# Patient Record
Sex: Female | Born: 1985 | Race: White | Hispanic: No | Marital: Single | Smoking: Never smoker
Health system: Southern US, Community
[De-identification: ages and names within clinical notes are randomized; demographics above are authoritative.]

## PROBLEM LIST (undated history)

## (undated) DIAGNOSIS — Z8489 Family history of other specified conditions: Secondary | ICD-10-CM

## (undated) DIAGNOSIS — K219 Gastro-esophageal reflux disease without esophagitis: Secondary | ICD-10-CM

## (undated) DIAGNOSIS — F419 Anxiety disorder, unspecified: Secondary | ICD-10-CM

## (undated) DIAGNOSIS — F329 Major depressive disorder, single episode, unspecified: Secondary | ICD-10-CM

## (undated) DIAGNOSIS — J45909 Unspecified asthma, uncomplicated: Secondary | ICD-10-CM

## (undated) DIAGNOSIS — N6310 Unspecified lump in the right breast, unspecified quadrant: Secondary | ICD-10-CM

## (undated) DIAGNOSIS — F32A Depression, unspecified: Secondary | ICD-10-CM

## (undated) HISTORY — DX: Depression, unspecified: F32.A

## (undated) HISTORY — DX: Major depressive disorder, single episode, unspecified: F32.9

## (undated) HISTORY — DX: Unspecified asthma, uncomplicated: J45.909

## (undated) HISTORY — DX: Anxiety disorder, unspecified: F41.9

## (undated) HISTORY — PX: WISDOM TOOTH EXTRACTION: SHX21

---
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
## 2006-03-14 ENCOUNTER — Encounter: Admission: RE | Admit: 2006-03-14 | Discharge: 2006-03-14 | Payer: Self-pay | Admitting: Pediatrics

## 2007-04-20 ENCOUNTER — Emergency Department (HOSPITAL_COMMUNITY): Admission: EM | Admit: 2007-04-20 | Discharge: 2007-04-20 | Payer: Self-pay | Admitting: Emergency Medicine

## 2007-04-29 ENCOUNTER — Encounter: Admission: RE | Admit: 2007-04-29 | Discharge: 2007-04-29 | Payer: Self-pay | Admitting: Family Medicine

## 2007-05-26 ENCOUNTER — Encounter: Admission: RE | Admit: 2007-05-26 | Discharge: 2007-05-26 | Payer: Self-pay | Admitting: Obstetrics and Gynecology

## 2009-09-07 ENCOUNTER — Encounter: Admission: RE | Admit: 2009-09-07 | Discharge: 2009-09-07 | Payer: Self-pay | Admitting: Obstetrics and Gynecology

## 2011-01-05 ENCOUNTER — Emergency Department (HOSPITAL_COMMUNITY)
Admission: EM | Admit: 2011-01-05 | Discharge: 2011-01-06 | Disposition: A | Discharge status: Home / Self Care | Payer: Medicaid Other | Attending: Emergency Medicine | Admitting: Emergency Medicine

## 2011-01-05 ENCOUNTER — Emergency Department (HOSPITAL_COMMUNITY): Payer: Medicaid Other

## 2011-01-05 DIAGNOSIS — Y92009 Unspecified place in unspecified non-institutional (private) residence as the place of occurrence of the external cause: Secondary | ICD-10-CM | POA: Insufficient documentation

## 2011-01-05 DIAGNOSIS — S5000XA Contusion of unspecified elbow, initial encounter: Secondary | ICD-10-CM | POA: Insufficient documentation

## 2011-01-05 DIAGNOSIS — W010XXA Fall on same level from slipping, tripping and stumbling without subsequent striking against object, initial encounter: Secondary | ICD-10-CM | POA: Insufficient documentation

## 2011-08-21 LAB — URINALYSIS, ROUTINE W REFLEX MICROSCOPIC
Bilirubin Urine: NEGATIVE
Glucose, UA: NEGATIVE
Ketones, ur: NEGATIVE
Leukocytes, UA: NEGATIVE
Nitrite: NEGATIVE
Protein, ur: NEGATIVE
Specific Gravity, Urine: 1.025
Urobilinogen, UA: 0.2
pH: 6

## 2011-08-21 LAB — URINE MICROSCOPIC-ADD ON

## 2011-08-21 LAB — PREGNANCY, URINE: Preg Test, Ur: NEGATIVE

## 2012-06-28 ENCOUNTER — Encounter (HOSPITAL_COMMUNITY): Payer: Self-pay | Admitting: *Deleted

## 2012-06-28 ENCOUNTER — Emergency Department (HOSPITAL_COMMUNITY): Payer: Medicaid Other

## 2012-06-28 ENCOUNTER — Emergency Department (HOSPITAL_COMMUNITY)
Admission: EM | Admit: 2012-06-28 | Discharge: 2012-06-28 | Disposition: A | Discharge status: Home / Self Care | Payer: Medicaid Other | Attending: Emergency Medicine | Admitting: Emergency Medicine

## 2012-06-28 DIAGNOSIS — S92309A Fracture of unspecified metatarsal bone(s), unspecified foot, initial encounter for closed fracture: Secondary | ICD-10-CM | POA: Insufficient documentation

## 2012-06-28 DIAGNOSIS — S92301A Fracture of unspecified metatarsal bone(s), right foot, initial encounter for closed fracture: Secondary | ICD-10-CM

## 2012-06-28 DIAGNOSIS — W108XXA Fall (on) (from) other stairs and steps, initial encounter: Secondary | ICD-10-CM | POA: Insufficient documentation

## 2012-06-28 DIAGNOSIS — Y92009 Unspecified place in unspecified non-institutional (private) residence as the place of occurrence of the external cause: Secondary | ICD-10-CM | POA: Insufficient documentation

## 2012-06-28 MED ORDER — IBUPROFEN 800 MG PO TABS
800.0000 mg | ORAL_TABLET | Freq: Once | ORAL | Status: AC
  Administered 2012-06-28: 800 mg via ORAL
  Filled 2012-06-28: qty 1

## 2012-06-28 MED ORDER — MELOXICAM 7.5 MG PO TABS: 7.5000 mg | ORAL_TABLET | Freq: Every day | ORAL | Status: AC

## 2012-06-28 MED ORDER — HYDROCODONE-ACETAMINOPHEN 5-325 MG PO TABS
2.0000 | ORAL_TABLET | Freq: Once | ORAL | Status: AC
  Administered 2012-06-28: 2 via ORAL
  Filled 2012-06-28: qty 2

## 2012-06-28 MED ORDER — HYDROCODONE-ACETAMINOPHEN 7.5-325 MG PO TABS: 1.0000 | ORAL_TABLET | ORAL | Status: DC | PRN

## 2012-06-28 MED ORDER — ONDANSETRON HCL 4 MG PO TABS
4.0000 mg | ORAL_TABLET | Freq: Once | ORAL | Status: AC
  Administered 2012-06-28: 4 mg via ORAL
  Filled 2012-06-28: qty 1

## 2012-06-28 NOTE — ED Provider Notes (Signed)
Medical screening examination/treatment/procedure(s) were performed by non-physician practitioner and as supervising physician I was immediately available for consultation/collaboration.   Shelda Jakes, MD 06/28/12 [phone]

## 2012-06-28 NOTE — ED Notes (Signed)
Pt presents with rt foot pain, twisted foot on steps.

## 2012-06-28 NOTE — ED Provider Notes (Signed)
History     CSN: [id number]  Arrival date & time 06/28/12  1641   First MD Initiated Contact with Patient 06/28/12 1701      Chief Complaint  Patient presents with  . Foot Injury    (Consider location/radiation/quality/duration/timing/severity/associated sxs/prior treatment) Patient is a 26 y.o. female presenting with foot injury. The history is provided by the patient.  Foot Injury  The incident occurred 1 to 2 hours ago. The incident occurred at home. The injury mechanism was a fall (fell down steps at home.). The pain is present in the left ankle. The quality of the pain is described as throbbing. The pain is moderate. The pain has been constant since onset. Associated symptoms include inability to bear weight. Pertinent negatives include no loss of sensation. She reports no foreign bodies present. The symptoms are aggravated by bearing weight and palpation. She has tried nothing for the symptoms.    History reviewed. No pertinent past medical history.  History reviewed. No pertinent past surgical history.  No family history on file.  History  Substance Use Topics  . Smoking status: Never Smoker   . Smokeless tobacco: Not on file  . Alcohol Use: Yes     occassional    OB History    Grav Para Term Preterm Abortions TAB SAB Ect Mult Living                  Review of Systems  Constitutional: Negative for activity change.       All ROS Neg except as noted in HPI  HENT: Negative for nosebleeds and neck pain.   Eyes: Negative for photophobia and discharge.  Respiratory: Negative for cough, shortness of breath and wheezing.   Cardiovascular: Negative for chest pain and palpitations.  Gastrointestinal: Negative for abdominal pain and blood in stool.  Genitourinary: Negative for dysuria, frequency and hematuria.  Musculoskeletal: Negative for back pain and arthralgias.  Skin: Negative.   Neurological: Negative for dizziness, seizures and speech difficulty.    Psychiatric/Behavioral: Negative for hallucinations and confusion.    Allergies  Penicillins  Home Medications  No current outpatient prescriptions on file.  BP 124/76  Pulse 92  Temp 98.7 F (37.1 C) (Oral)  Resp 18  Ht 5\' 7"  (1.702 m)  Wt 170 lb (77.111 kg)  BMI 26.63 kg/m2  SpO2 98%  LMP 05/28/2012  Physical Exam  Nursing note and vitals reviewed. Constitutional: She is oriented to person, place, and time. She appears well-developed and well-nourished.  Non-toxic appearance.  HENT:  Head: Normocephalic.  Right Ear: Tympanic membrane and external ear normal.  Left Ear: Tympanic membrane and external ear normal.  Eyes: EOM and lids are normal. Pupils are equal, round, and reactive to light.  Neck: Normal range of motion. Neck supple. Carotid bruit is not present.  Cardiovascular: Normal rate, regular rhythm, normal heart sounds, intact distal pulses and normal pulses.   Pulmonary/Chest: Breath sounds normal. No respiratory distress.  Abdominal: Soft. Bowel sounds are normal. There is no tenderness. There is no guarding.  Musculoskeletal: Normal range of motion.       Pain and swelling of the lateral malleolus. Good cap refill and sensory of the toes.  Achilles intact. DP pulse 2+  Lymphadenopathy:       Head (right side): No submandibular adenopathy present.       Head (left side): No submandibular adenopathy present.    She has no cervical adenopathy.  Neurological: She is alert and oriented to person, place,  and time. She has normal strength. No cranial nerve deficit or sensory deficit.  Skin: Skin is warm and dry.  Psychiatric: She has a normal mood and affect. Her speech is normal.    ED Course  Procedures (including critical care time)  Labs Reviewed - No data to display No results found.   No diagnosis found.    MDM  I have reviewed nursing notes, vital signs, and all appropriate lab and imaging results for this patient. Patient sustained an injury to  the right foot earlier today. She has increasing pain and swelling of the right foot. X-ray of the right foot and ankle reveal a nondisplaced fracture at the base of the fifth metatarsal. The patient has been fitted with a posterior splint and crutches. Prescription for Mobic 7.5 mg 2 times daily, and Norco 7.5 mg every 4 hours as been given to the patient. Patient has been given orthopedic referral.       Kathie Dike, PA 06/28/12 1821

## 2012-06-28 NOTE — ED Notes (Signed)
Pt states twisted rt foot on stairs. Pt has small swelling noted on  Foot. Denies other injuries at this time.

## 2012-06-30 ENCOUNTER — Encounter: Payer: Self-pay | Admitting: Orthopedic Surgery

## 2012-06-30 ENCOUNTER — Ambulatory Visit (INDEPENDENT_AMBULATORY_CARE_PROVIDER_SITE_OTHER): Payer: Medicaid Other | Admitting: Orthopedic Surgery

## 2012-06-30 VITALS — BP 100/70 | Ht 67.0 in | Wt 170.0 lb

## 2012-06-30 DIAGNOSIS — S92309A Fracture of unspecified metatarsal bone(s), unspecified foot, initial encounter for closed fracture: Secondary | ICD-10-CM

## 2012-06-30 MED ORDER — HYDROCODONE-ACETAMINOPHEN 7.5-325 MG PO TABS: 1.0000 | ORAL_TABLET | ORAL | Status: AC | PRN

## 2012-06-30 NOTE — Progress Notes (Signed)
  Subjective:    Patient ID: Jade Kelly, female    DOB: 05-21-1986, 26 y.o.   MRN: [id number] Chief Complaint  Patient presents with  . Foot Injury    right foot fracture, DOI 06/28/12    Foot Injury  The incident occurred 3 to 5 days ago. The injury mechanism was a fall. The pain is present in the right foot. The pain is moderate. The pain has been constant since onset. Pertinent negatives include no inability to bear weight, loss of motion, loss of sensation, muscle weakness, numbness or tingling. Associated symptoms comments: Swelling.      Review of Systems  Neurological: Negative for tingling and numbness.   as recorded see scanned documents     Objective:   Physical Exam  Constitutional: She is oriented to person, place, and time. She appears well-developed and well-nourished. No distress.  HENT:  Head: Normocephalic.  Eyes: Pupils are equal, round, and reactive to light.  Neck: Normal range of motion.  Cardiovascular: Normal rate and intact distal pulses.   Pulmonary/Chest: Effort normal.  Musculoskeletal: Normal range of motion.       Upper extremities Muscle tone normal, all joints are reduced without subluxation. No joint contractures. No swelling.  Right lower extremity Swollen right foot Tenderness over the proximal fifth metatarsal. No crepitance appear Alignment is normal Ankle range of motion normal Muscle tone normal no atrophy Ankle joint stable  Neurological: She is alert and oriented to person, place, and time. She has normal reflexes.  Skin: She is not diaphoretic.  Psychiatric: She has a normal mood and affect. Her behavior is normal. Judgment and thought content normal.     Imaging x-rays from the hospital show a transverse fracture at the proximal portion of the fifth metatarsal which is nondisplaced and proximal to the area of the true Jones fracture     Assessment & Plan:  Proximal fifth metatarsal fracture nondisplaced  Recommend hard  soled shoe weightbearing as tolerated x-ray in 6 weeks

## 2012-06-30 NOTE — Patient Instructions (Addendum)
Walk in shoe x 6 weeks

## 2012-08-11 ENCOUNTER — Ambulatory Visit (INDEPENDENT_AMBULATORY_CARE_PROVIDER_SITE_OTHER): Payer: Medicaid Other

## 2012-08-11 ENCOUNTER — Encounter: Payer: Self-pay | Admitting: Orthopedic Surgery

## 2012-08-11 ENCOUNTER — Ambulatory Visit: Payer: Medicaid Other | Admitting: Orthopedic Surgery

## 2012-08-11 ENCOUNTER — Ambulatory Visit (INDEPENDENT_AMBULATORY_CARE_PROVIDER_SITE_OTHER): Payer: Medicaid Other | Admitting: Orthopedic Surgery

## 2012-08-11 VITALS — Ht 67.0 in | Wt 170.0 lb

## 2012-08-11 DIAGNOSIS — S92901A Unspecified fracture of right foot, initial encounter for closed fracture: Secondary | ICD-10-CM

## 2012-08-11 DIAGNOSIS — S92909A Unspecified fracture of unspecified foot, initial encounter for closed fracture: Secondary | ICD-10-CM

## 2012-08-11 NOTE — Progress Notes (Signed)
Patient ID: Jade Kelly, female   DOB: [birth year], 25 y.o.   MRN: [id number]  Chief Complaint  Patient presents with  . Follow-up    6 week recheck on right foot fracture. Xrays today.    And the x-ray shows: Complete resolution of a fracture at the 5th metatarsal on the proximal leg.  Clinical exam is normal.  Return to normal activities

## 2012-08-11 NOTE — Patient Instructions (Addendum)
activities as tolerated 

## 2013-09-29 ENCOUNTER — Other Ambulatory Visit: Payer: Self-pay | Admitting: Family Medicine

## 2013-09-29 DIAGNOSIS — N6001 Solitary cyst of right breast: Secondary | ICD-10-CM

## 2013-10-05 ENCOUNTER — Other Ambulatory Visit: Payer: Self-pay

## 2013-10-05 DIAGNOSIS — Z1231 Encounter for screening mammogram for malignant neoplasm of breast: Secondary | ICD-10-CM

## 2013-10-06 ENCOUNTER — Ambulatory Visit
Admission: RE | Admit: 2013-10-06 | Discharge: 2013-10-06 | Disposition: A | Discharge status: Home / Self Care | Payer: Medicaid Other | Source: Ambulatory Visit | Attending: Family Medicine | Admitting: Family Medicine

## 2013-10-06 DIAGNOSIS — N6001 Solitary cyst of right breast: Secondary | ICD-10-CM

## 2013-10-08 ENCOUNTER — Ambulatory Visit
Admission: RE | Admit: 2013-10-08 | Discharge: 2013-10-08 | Disposition: A | Discharge status: Home / Self Care | Payer: Medicaid Other | Source: Ambulatory Visit | Attending: Family Medicine | Admitting: Family Medicine

## 2013-10-08 DIAGNOSIS — N6001 Solitary cyst of right breast: Secondary | ICD-10-CM

## 2013-10-26 ENCOUNTER — Ambulatory Visit (INDEPENDENT_AMBULATORY_CARE_PROVIDER_SITE_OTHER): Payer: Medicaid Other | Admitting: Surgery

## 2013-11-04 DIAGNOSIS — N6310 Unspecified lump in the right breast, unspecified quadrant: Secondary | ICD-10-CM

## 2013-11-04 HISTORY — DX: Unspecified lump in the right breast, unspecified quadrant: N63.10

## 2013-11-08 ENCOUNTER — Encounter (INDEPENDENT_AMBULATORY_CARE_PROVIDER_SITE_OTHER): Payer: Self-pay

## 2013-11-08 ENCOUNTER — Encounter (INDEPENDENT_AMBULATORY_CARE_PROVIDER_SITE_OTHER): Payer: Self-pay | Admitting: Surgery

## 2013-11-08 ENCOUNTER — Ambulatory Visit (INDEPENDENT_AMBULATORY_CARE_PROVIDER_SITE_OTHER): Payer: Medicaid Other | Admitting: Surgery

## 2013-11-08 VITALS — BP 118/80 | HR 84 | Temp 98.9°F | Resp 15 | Ht 67.0 in | Wt 190.0 lb

## 2013-11-08 DIAGNOSIS — N6310 Unspecified lump in the right breast, unspecified quadrant: Secondary | ICD-10-CM

## 2013-11-08 DIAGNOSIS — N63 Unspecified lump in unspecified breast: Secondary | ICD-10-CM

## 2013-11-08 NOTE — Patient Instructions (Signed)
Lumpectomy A lumpectomy is a form of "breast conserving" or "breast preservation" surgery. It may also be referred to as a partial mastectomy. During a lumpectomy, the portion of the breast that contains the cancerous tumor or breast mass (the lump) is removed. Some normal tissue around the lump may also be removed to make sure all the tumor has been removed. This surgery should take 40 minutes or less. LET YOUR HEALTH CARE PROVIDER KNOW ABOUT:  Any allergies you have.  All medicines you are taking, including vitamins, herbs, eye drops, creams, and over-the-counter medicines.  Previous problems you or members of your family have had with the use of anesthetics.  Any blood disorders you have.  Previous surgeries you have had.  Medical conditions you have. RISKS AND COMPLICATIONS Generally, this is a safe procedure. However, as with any procedure, complications can occur. Possible complications include:  Bleeding.  Infection.  Pain.  Temporary swelling.  Change in the shape of the breast, particularly if a large portion is removed. BEFORE THE PROCEDURE  Ask your health care provider about changing or stopping your regular medicines.  Do not eat or drink anything for 7 8 hours before the surgery or as directed by your health care provider. Ask your health care provider if you can take a sip of water with any approved medicines.  On the day of surgery, your healthcare provider will use a mammogram or ultrasound to locate and mark the tumor in your breast. These markings on your breast will show where the cut (incision) will be made. PROCEDURE   An IV tube will be put into one of your veins.  You may be given medicine to help you relax before the surgery (sedative). You will be given one of the following:  A medicine that numbs the area (local anesthesia).  A medicine that makes you go to sleep (general anesthesia).  Your health care provider will use a kind of electric scalpel  that uses heat to minimize bleeding (electrocautery knife).  A curved incision (like a smile or frown) that follows the natural curve of your breast is made, to allow for minimal scarring and better healing.  The tumor will be removed with some of the surrounding tissue. This will be sent to the lab for analysis. Your health care provider may also remove your lymph nodes at this time if needed.  Sometimes, but not always, a rubber tube called a drain will be surgically inserted into your breast area or armpit to collect excess fluid that may accumulate in the space where the tumor was. This drain is connected to a plastic bulb on the outside of your body. This drain creates suction to help remove the fluid.  The incisions will be closed with stitches (sutures).  A bandage may be placed over the incisions. AFTER THE PROCEDURE  You will be taken to the recovery area.  You will be given medicine for pain.  A small rubber drain may be placed in the breast for 2 3 days to prevent a collection of blood (hematoma) from developing in the breast. You will be given instructions on caring for the drain before you go home.  A pressure bandage (dressing) will be applied for 1 2 days to prevent bleeding. Ask your health care provider how to care for your bandage at home. Document Released: 12/02/2006 Document Revised: 06/23/2013 Document Reviewed: 03/26/2013 [employer] Patient Information 2014 [employer].  

## 2013-11-08 NOTE — Progress Notes (Signed)
Patient ID: Jade Kelly, female   DOB: 08/09/1986, 28 y.o.   MRN: [id number]  Chief Complaint  Patient presents with  . New Evaluation    eval rt br cyst?    HPI Jade Kelly is a 28 y.o. female.  Patient sent at request of Dr. Eagle for right breast mass. Patient has noted a tender area in her right upper-outer quadrant breast for at least 2 months. It is very tender and limits her activities. She was evaluated with ultrasound which showed a cluster of cysts in this area and aspiration was attempted but was unsuccessful. The area persists and the patient wishes to have it removed. She has no other areas of pain in either breast except this. There is no nipple discharge. No redness of breast. HPI  Past Medical History  Diagnosis Date  . Depression   . Asthma   . Anxiety     Past Surgical History  Procedure Laterality Date  . Wisdom tooth extraction      Family History  Problem Relation Age of Onset  . Diabetes      Social History History  Substance Use Topics  . Smoking status: Never Smoker   . Smokeless tobacco: Never Used  . Alcohol Use: Yes     Comment: occassional    Allergies  Allergen Reactions  . Penicillins Rash    Current Outpatient Prescriptions  Medication Sig Dispense Refill  . albuterol (PROAIR HFA) 108 (90 BASE) MCG/ACT inhaler       . BuPROPion HCl (WELLBUTRIN PO) Take by mouth.      . FLUoxetine HCl (PROZAC PO) Take by mouth.      . Norgestim-Eth Estrad Triphasic (ORTHO TRI-CYCLEN LO PO) Take by mouth.       No current facility-administered medications for this visit.    Review of Systems Review of Systems  Constitutional: Negative for fever, chills and unexpected weight change.  HENT: Negative for congestion, hearing loss, sore throat, trouble swallowing and voice change.   Eyes: Negative for visual disturbance.  Respiratory: Negative for cough and wheezing.   Cardiovascular: Negative for chest pain, palpitations and leg swelling.    Gastrointestinal: Negative for nausea, vomiting, abdominal pain, diarrhea, constipation, blood in stool, abdominal distention and anal bleeding.  Genitourinary: Negative for hematuria, vaginal bleeding and difficulty urinating.  Musculoskeletal: Negative for arthralgias.  Skin: Negative for rash and wound.  Neurological: Negative for seizures, syncope and headaches.  Hematological: Negative for adenopathy. Does not bruise/bleed easily.  Psychiatric/Behavioral: Negative for confusion.    Blood pressure 118/80, pulse 84, temperature 98.9 F (37.2 C), temperature source Temporal, resp. rate 15, height 5' 7" (1.702 m), weight 190 lb (86.183 kg).  Physical Exam Physical Exam  Constitutional: She is oriented to person, place, and time. She appears well-developed and well-nourished.  HENT:  Head: Normocephalic and atraumatic.  Eyes: Pupils are equal, round, and reactive to light. No scleral icterus.  Neck: Normal range of motion. Neck supple.  Cardiovascular: Normal rate and regular rhythm.   Pulmonary/Chest: Effort normal and breath sounds normal.    Musculoskeletal: Normal range of motion.  Lymphadenopathy:    She has no cervical adenopathy.    She has no axillary adenopathy.  Neurological: She is alert and oriented to person, place, and time.  Skin: Skin is warm and dry.  Psychiatric: She has a normal mood and affect. Her behavior is normal. Judgment and thought content normal.    Data Reviewed CLINICAL DATA: Patient has been experiencing   pain in the right  upper outer quadrant.  EXAM:  ULTRASOUND OF THE RIGHT BREAST  COMPARISON: 05/26/2007, 09/07/2009  FINDINGS:  On physical exam,no mass is palpated in the right upper outer  quadrant. The patient points to the area of concern as being at 10  o'clock 5 cm from the right nipple.  Ultrasound is performed, showing clustered cysts in this location  with the largest cyst measuring 8 mm. There is no suspicious solid  mass,  distortion, or shadowing to suggest malignancy.  IMPRESSION:  Clustered cysts in the right upper outer quadrant with no  sonographic evidence of malignancy.  RECOMMENDATION:  Yearly screening mammography should begin at age 79 unless  clinically indicated earlier.  I have discussed the findings and recommendations with the patient.  Results were also provided in writing at the conclusion of the  visit. If applicable, a reminder letter will be sent to the patient  regarding the next appointment.  BI-RADS CATEGORY 2: Benign Finding(s)  Electronically Signed  By: Ulyess Blossom M.D.  On: 10/06/2013 11:44   Assessment    Painful right breast mass/cyst    Plan    The patient would like to have the area removed due to discomfort. Explained this may not help her breast pain. She still would like to proceed. Recommend right breast excisional biopsy.The procedure has been discussed with the patient.  Alternative therapies have been discussed with the patient.  Operative risks include bleeding,  Infection,  Organ injury,  Nerve injury,  Blood vessel injury,  DVT,  Pulmonary embolism,  Death,  And possible reoperation.  Medical management risks include worsening of present situation.  The success of the procedure is 50 -90 % at treating patients symptoms.  The patient understands and agrees to proceed.       Chelle Cayton A. 11/08/2013, 2:41 PM

## 2013-11-25 ENCOUNTER — Encounter (HOSPITAL_BASED_OUTPATIENT_CLINIC_OR_DEPARTMENT_OTHER): Payer: Self-pay | Admitting: *Deleted

## 2013-12-02 ENCOUNTER — Encounter (HOSPITAL_BASED_OUTPATIENT_CLINIC_OR_DEPARTMENT_OTHER): Payer: Self-pay | Admitting: Anesthesiology

## 2013-12-02 ENCOUNTER — Ambulatory Visit (HOSPITAL_BASED_OUTPATIENT_CLINIC_OR_DEPARTMENT_OTHER)
Admission: RE | Admit: 2013-12-02 | Discharge: 2013-12-02 | Disposition: A | Discharge status: Home / Self Care | Payer: Medicaid Other | Source: Ambulatory Visit | Attending: Surgery | Admitting: Surgery

## 2013-12-02 ENCOUNTER — Ambulatory Visit (HOSPITAL_BASED_OUTPATIENT_CLINIC_OR_DEPARTMENT_OTHER): Payer: Medicaid Other | Admitting: Anesthesiology

## 2013-12-02 ENCOUNTER — Encounter (HOSPITAL_BASED_OUTPATIENT_CLINIC_OR_DEPARTMENT_OTHER): Payer: Medicaid Other | Admitting: Anesthesiology

## 2013-12-02 ENCOUNTER — Encounter (HOSPITAL_BASED_OUTPATIENT_CLINIC_OR_DEPARTMENT_OTHER)
Admission: RE | Disposition: A | Discharge status: Home / Self Care | Payer: Self-pay | Source: Ambulatory Visit | Attending: Surgery

## 2013-12-02 DIAGNOSIS — N6310 Unspecified lump in the right breast, unspecified quadrant: Secondary | ICD-10-CM

## 2013-12-02 DIAGNOSIS — D249 Benign neoplasm of unspecified breast: Secondary | ICD-10-CM | POA: Insufficient documentation

## 2013-12-02 DIAGNOSIS — F411 Generalized anxiety disorder: Secondary | ICD-10-CM | POA: Insufficient documentation

## 2013-12-02 DIAGNOSIS — N63 Unspecified lump in unspecified breast: Secondary | ICD-10-CM | POA: Insufficient documentation

## 2013-12-02 DIAGNOSIS — F3289 Other specified depressive episodes: Secondary | ICD-10-CM | POA: Insufficient documentation

## 2013-12-02 DIAGNOSIS — N6019 Diffuse cystic mastopathy of unspecified breast: Secondary | ICD-10-CM

## 2013-12-02 DIAGNOSIS — F329 Major depressive disorder, single episode, unspecified: Secondary | ICD-10-CM | POA: Insufficient documentation

## 2013-12-02 DIAGNOSIS — J45909 Unspecified asthma, uncomplicated: Secondary | ICD-10-CM | POA: Insufficient documentation

## 2013-12-02 HISTORY — DX: Unspecified lump in the right breast, unspecified quadrant: N63.10

## 2013-12-02 HISTORY — DX: Family history of other specified conditions: Z84.89

## 2013-12-02 HISTORY — PX: EXCISION OF BREAST BIOPSY: SHX5822

## 2013-12-02 LAB — POCT HEMOGLOBIN-HEMACUE: Hemoglobin: 13.6 g/dL (ref 12.0–15.0)

## 2013-12-02 SURGERY — EXCISION OF BREAST BIOPSY
Anesthesia: General | Site: Breast | Laterality: Right

## 2013-12-02 MED ORDER — EPHEDRINE SULFATE 50 MG/ML IJ SOLN
INTRAMUSCULAR | Status: DC | PRN
  Administered 2013-12-02: 5 mg via INTRAVENOUS
  Administered 2013-12-02 (×2): 10 mg via INTRAVENOUS

## 2013-12-02 MED ORDER — LACTATED RINGERS IV SOLN
INTRAVENOUS | Status: DC
  Administered 2013-12-02 (×2): via INTRAVENOUS

## 2013-12-02 MED ORDER — PROPOFOL 10 MG/ML IV BOLUS
INTRAVENOUS | Status: DC | PRN
  Administered 2013-12-02: 200 mg via INTRAVENOUS

## 2013-12-02 MED ORDER — MIDAZOLAM HCL 2 MG/2ML IJ SOLN: 1.0000 mg | INTRAMUSCULAR | Status: DC | PRN

## 2013-12-02 MED ORDER — BUPIVACAINE-EPINEPHRINE PF 0.25-1:200000 % IJ SOLN
INTRAMUSCULAR | Status: AC
  Filled 2013-12-02: qty 30

## 2013-12-02 MED ORDER — FENTANYL CITRATE 0.05 MG/ML IJ SOLN
INTRAMUSCULAR | Status: AC
  Filled 2013-12-02: qty 6

## 2013-12-02 MED ORDER — CLINDAMYCIN PHOSPHATE 300 MG/50ML IV SOLN
INTRAVENOUS | Status: AC
  Filled 2013-12-02: qty 50

## 2013-12-02 MED ORDER — FENTANYL CITRATE 0.05 MG/ML IJ SOLN: 50.0000 ug | INTRAMUSCULAR | Status: DC | PRN

## 2013-12-02 MED ORDER — LIDOCAINE HCL (CARDIAC) 20 MG/ML IV SOLN
INTRAVENOUS | Status: DC | PRN
  Administered 2013-12-02: 50 mg via INTRAVENOUS

## 2013-12-02 MED ORDER — MIDAZOLAM HCL 2 MG/2ML IJ SOLN
INTRAMUSCULAR | Status: AC
  Filled 2013-12-02: qty 2

## 2013-12-02 MED ORDER — OXYCODONE HCL 5 MG/5ML PO SOLN: 5.0000 mg | Freq: Once | ORAL | Status: DC | PRN

## 2013-12-02 MED ORDER — FENTANYL CITRATE 0.05 MG/ML IJ SOLN
INTRAMUSCULAR | Status: DC | PRN
  Administered 2013-12-02: 100 ug via INTRAVENOUS

## 2013-12-02 MED ORDER — MIDAZOLAM HCL 5 MG/5ML IJ SOLN
INTRAMUSCULAR | Status: DC | PRN
  Administered 2013-12-02: 2 mg via INTRAVENOUS

## 2013-12-02 MED ORDER — BUPIVACAINE-EPINEPHRINE 0.25% -1:200000 IJ SOLN
INTRAMUSCULAR | Status: DC | PRN
  Administered 2013-12-02: 10 mL

## 2013-12-02 MED ORDER — MIDAZOLAM HCL 2 MG/ML PO SYRP: 12.0000 mg | ORAL_SOLUTION | Freq: Once | ORAL | Status: DC | PRN

## 2013-12-02 MED ORDER — ONDANSETRON HCL 4 MG/2ML IJ SOLN
INTRAMUSCULAR | Status: DC | PRN
  Administered 2013-12-02: 4 mg via INTRAVENOUS

## 2013-12-02 MED ORDER — HYDROMORPHONE HCL PF 1 MG/ML IJ SOLN
0.2500 mg | INTRAMUSCULAR | Status: DC | PRN
  Administered 2013-12-02: 0.5 mg via INTRAVENOUS
  Filled 2013-12-02: qty 1

## 2013-12-02 MED ORDER — OXYCODONE-ACETAMINOPHEN 5-325 MG PO TABS: 1.0000 | ORAL_TABLET | ORAL | Status: DC | PRN

## 2013-12-02 MED ORDER — 0.9 % SODIUM CHLORIDE (POUR BTL) OPTIME
TOPICAL | Status: DC | PRN
  Administered 2013-12-02: 200 mL

## 2013-12-02 MED ORDER — DEXAMETHASONE SODIUM PHOSPHATE 4 MG/ML IJ SOLN
INTRAMUSCULAR | Status: DC | PRN
  Administered 2013-12-02: 10 mg via INTRAVENOUS

## 2013-12-02 MED ORDER — OXYCODONE HCL 5 MG PO TABS: 5.0000 mg | ORAL_TABLET | Freq: Once | ORAL | Status: DC | PRN

## 2013-12-02 MED ORDER — ONDANSETRON HCL 4 MG/2ML IJ SOLN: 4.0000 mg | Freq: Once | INTRAMUSCULAR | Status: DC | PRN

## 2013-12-02 MED ORDER — CLINDAMYCIN PHOSPHATE 300 MG/50ML IV SOLN
300.0000 mg | Freq: Once | INTRAVENOUS | Status: AC
  Administered 2013-12-02: 300 mg via INTRAVENOUS

## 2013-12-02 SURGICAL SUPPLY — 50 items
BINDER BREAST LRG (GAUZE/BANDAGES/DRESSINGS) IMPLANT
BINDER BREAST MEDIUM (GAUZE/BANDAGES/DRESSINGS) IMPLANT
BINDER BREAST XLRG (GAUZE/BANDAGES/DRESSINGS) IMPLANT
BINDER BREAST XXLRG (GAUZE/BANDAGES/DRESSINGS) IMPLANT
BLADE SURG 15 STRL LF DISP TIS (BLADE) ×1 IMPLANT
BLADE SURG 15 STRL SS (BLADE) ×2
CANISTER SUCT 1200ML W/VALVE (MISCELLANEOUS) ×3 IMPLANT
CHLORAPREP W/TINT 26ML (MISCELLANEOUS) ×3 IMPLANT
CLIP TI WIDE RED SMALL 6 (CLIP) IMPLANT
COVER MAYO STAND STRL (DRAPES) ×3 IMPLANT
COVER TABLE BACK 60X90 (DRAPES) ×3 IMPLANT
DECANTER SPIKE VIAL GLASS SM (MISCELLANEOUS) ×3 IMPLANT
DERMABOND ADVANCED (GAUZE/BANDAGES/DRESSINGS) ×2
DERMABOND ADVANCED .7 DNX12 (GAUZE/BANDAGES/DRESSINGS) ×1 IMPLANT
DEVICE DUBIN W/COMP PLATE 8390 (MISCELLANEOUS) IMPLANT
DRAPE LAPAROSCOPIC ABDOMINAL (DRAPES) IMPLANT
DRAPE PED LAPAROTOMY (DRAPES) ×3 IMPLANT
DRAPE UTILITY XL STRL (DRAPES) ×3 IMPLANT
ELECT COATED BLADE 2.86 ST (ELECTRODE) ×3 IMPLANT
ELECT REM PT RETURN 9FT ADLT (ELECTROSURGICAL) ×3
ELECTRODE REM PT RTRN 9FT ADLT (ELECTROSURGICAL) ×1 IMPLANT
GLOVE BIOGEL M STRL SZ7.5 (GLOVE) ×3 IMPLANT
GLOVE BIOGEL PI IND STRL 7.5 (GLOVE) ×1 IMPLANT
GLOVE BIOGEL PI IND STRL 8 (GLOVE) ×2 IMPLANT
GLOVE BIOGEL PI INDICATOR 7.5 (GLOVE) ×2
GLOVE BIOGEL PI INDICATOR 8 (GLOVE) ×4
GLOVE ECLIPSE 7.0 STRL STRAW (GLOVE) ×3 IMPLANT
GLOVE ECLIPSE 8.0 STRL XLNG CF (GLOVE) ×3 IMPLANT
GLOVE EXAM NITRILE LRG STRL (GLOVE) ×3 IMPLANT
GOWN STRL REUS W/ TWL LRG LVL3 (GOWN DISPOSABLE) ×2 IMPLANT
GOWN STRL REUS W/TWL 2XL LVL3 (GOWN DISPOSABLE) ×3 IMPLANT
GOWN STRL REUS W/TWL LRG LVL3 (GOWN DISPOSABLE) ×4
KIT MARKER MARGIN INK (KITS) IMPLANT
NEEDLE HYPO 25X1 1.5 SAFETY (NEEDLE) ×3 IMPLANT
NS IRRIG 1000ML POUR BTL (IV SOLUTION) ×3 IMPLANT
PACK BASIN DAY SURGERY FS (CUSTOM PROCEDURE TRAY) ×3 IMPLANT
PENCIL BUTTON HOLSTER BLD 10FT (ELECTRODE) ×3 IMPLANT
SLEEVE SCD COMPRESS KNEE MED (MISCELLANEOUS) ×3 IMPLANT
SPONGE LAP 4X18 X RAY DECT (DISPOSABLE) ×3 IMPLANT
STAPLER VISISTAT 35W (STAPLE) IMPLANT
SUT MON AB 4-0 PC3 18 (SUTURE) ×3 IMPLANT
SUT SILK 2 0 SH (SUTURE) IMPLANT
SUT VIC AB 3-0 SH 27 (SUTURE) ×2
SUT VIC AB 3-0 SH 27X BRD (SUTURE) ×1 IMPLANT
SYR CONTROL 10ML LL (SYRINGE) ×3 IMPLANT
TOWEL OR 17X24 6PK STRL BLUE (TOWEL DISPOSABLE) ×6 IMPLANT
TOWEL OR NON WOVEN STRL DISP B (DISPOSABLE) ×3 IMPLANT
TUBE CONNECTING 20'X1/4 (TUBING) ×1
TUBE CONNECTING 20X1/4 (TUBING) ×2 IMPLANT
YANKAUER SUCT BULB TIP NO VENT (SUCTIONS) ×3 IMPLANT

## 2013-12-02 NOTE — Interval H&P Note (Signed)
History and Physical Interval Note:  12/02/2013 8:30 AM  Jade Kelly  has presented today for surgery, with the diagnosis of right breast mass  The various methods of treatment have been discussed with the patient and family. After consideration of risks, benefits and other options for treatment, the patient has consented to  Procedure(s): RIGHT EXCISIONAL BREAST BIOPSY (Right) as a surgical intervention .  The patient's history has been reviewed, patient examined, no change in status, stable for surgery.  I have reviewed the patient's chart and labs.  Questions were answered to the patient's satisfaction.     Iridessa Harrow A.

## 2013-12-02 NOTE — Anesthesia Preprocedure Evaluation (Signed)

## 2013-12-02 NOTE — Op Note (Signed)
Excisional Breast Lumpectomy Biopsy Procedure Note Right  Indications: This patient presents with history of a right breast mass. Given the clinical history and physical exam, along with indicated diagnostic studies, breast biopsy will be performed.The procedure has been discussed with the patient. Alternatives to surgery have been discussed with the patient.  Risks of surgery include bleeding,  Infection,  Seroma formation, death,  and the need for further surgery.   The patient understands and wishes to proceed.  Pre-operative Diagnosis: right breast mass  Post-operative Diagnosis: right breast mass  Surgeon: Malie Kashani A.   Assistants: OR staff  Anesthesia: General LMA anesthesia and Local anesthesia 0.25.% bupivacaine, with epinephrine  ASA Class: 2  Procedure Details  The patient was seen in the Holding Room. The risks, benefits, complications, treatment options, and expected outcomes were discussed with the patient. The possibilities of reaction to medication, pulmonary aspiration, bleeding, infection, the need for additional procedures, failure to diagnose a condition, and creating a complication requiring transfusion or operation were discussed with the patient. The patient concurred with the proposed plan, giving informed consent. The site of surgery properly noted/marked. The patient was taken to Operating Room, identified as Jade Kelly, and the procedure verified as lumpectomy. A Time Out was held and the above information confirmed.  After induction of anesthesia, the right breast and chest were prepped and draped in standard fashion. The lumpectomy was performed by creating an oblique incision over the upper outer quadrant of the breast. hemostasis was achieved with cautery. The wound was irrigated and closed with a 3-0 Vicryl  And 4 O monoceryl subcuticular closure in layers.    Sterile dressings were applied. At the end of the operation, all sponge, instrument, and  needle counts were correct.  Findings: grossly clear surgical margins  Estimated Blood Loss:  Minimal         Drains: nonoe         Total IV Fluids: 400 mL         Specimens: breast mass    right         Complications:  None; patient tolerated the procedure well.         Disposition: PACU - hemodynamically stable.         Condition: Stable

## 2013-12-02 NOTE — H&P (View-Only) (Signed)
Patient ID: Jade Kelly, female   DOB: 1986/10/06, 28 y.o.   MRN: [id number]  Chief Complaint  Patient presents with  . New Evaluation    eval rt br cyst?    HPI Jade Kelly is a 28 y.o. female.  Patient sent at request of Dr. Sadie Haber for right breast mass. Patient has noted a tender area in her right upper-outer quadrant breast for at least 2 months. It is very tender and limits her activities. She was evaluated with ultrasound which showed a cluster of cysts in this area and aspiration was attempted but was unsuccessful. The area persists and the patient wishes to have it removed. She has no other areas of pain in either breast except this. There is no nipple discharge. No redness of breast. HPI  Past Medical History  Diagnosis Date  . Depression   . Asthma   . Anxiety     Past Surgical History  Procedure Laterality Date  . Wisdom tooth extraction      Family History  Problem Relation Age of Onset  . Diabetes      Social History History  Substance Use Topics  . Smoking status: Never Smoker   . Smokeless tobacco: Never Used  . Alcohol Use: Yes     Comment: occassional    Allergies  Allergen Reactions  . Penicillins Rash    Current Outpatient Prescriptions  Medication Sig Dispense Refill  . albuterol (PROAIR HFA) 108 (90 BASE) MCG/ACT inhaler       . BuPROPion HCl (WELLBUTRIN PO) Take by mouth.      Marland Kitchen FLUoxetine HCl (PROZAC PO) Take by mouth.      Lenard Forth Triphasic (ORTHO TRI-CYCLEN LO PO) Take by mouth.       No current facility-administered medications for this visit.    Review of Systems Review of Systems  Constitutional: Negative for fever, chills and unexpected weight change.  HENT: Negative for congestion, hearing loss, sore throat, trouble swallowing and voice change.   Eyes: Negative for visual disturbance.  Respiratory: Negative for cough and wheezing.   Cardiovascular: Negative for chest pain, palpitations and leg swelling.    Gastrointestinal: Negative for nausea, vomiting, abdominal pain, diarrhea, constipation, blood in stool, abdominal distention and anal bleeding.  Genitourinary: Negative for hematuria, vaginal bleeding and difficulty urinating.  Musculoskeletal: Negative for arthralgias.  Skin: Negative for rash and wound.  Neurological: Negative for seizures, syncope and headaches.  Hematological: Negative for adenopathy. Does not bruise/bleed easily.  Psychiatric/Behavioral: Negative for confusion.    Blood pressure 118/80, pulse 84, temperature 98.9 F (37.2 C), temperature source Temporal, resp. rate 15, height 5\' 7"  (1.702 m), weight 190 lb (86.183 kg).  Physical Exam Physical Exam  Constitutional: She is oriented to person, place, and time. She appears well-developed and well-nourished.  HENT:  Head: Normocephalic and atraumatic.  Eyes: Pupils are equal, round, and reactive to light. No scleral icterus.  Neck: Normal range of motion. Neck supple.  Cardiovascular: Normal rate and regular rhythm.   Pulmonary/Chest: Effort normal and breath sounds normal.    Musculoskeletal: Normal range of motion.  Lymphadenopathy:    She has no cervical adenopathy.    She has no axillary adenopathy.  Neurological: She is alert and oriented to person, place, and time.  Skin: Skin is warm and dry.  Psychiatric: She has a normal mood and affect. Her behavior is normal. Judgment and thought content normal.    Data Reviewed CLINICAL DATA: Patient has been experiencing  pain in the right  upper outer quadrant.  EXAM:  ULTRASOUND OF THE RIGHT BREAST  COMPARISON: 05/26/2007, 09/07/2009  FINDINGS:  On physical exam,no mass is palpated in the right upper outer  quadrant. The patient points to the area of concern as being at 10  o'clock 5 cm from the right nipple.  Ultrasound is performed, showing clustered cysts in this location  with the largest cyst measuring 8 mm. There is no suspicious solid  mass,  distortion, or shadowing to suggest malignancy.  IMPRESSION:  Clustered cysts in the right upper outer quadrant with no  sonographic evidence of malignancy.  RECOMMENDATION:  Yearly screening mammography should begin at age 79 unless  clinically indicated earlier.  I have discussed the findings and recommendations with the patient.  Results were also provided in writing at the conclusion of the  visit. If applicable, a reminder letter will be sent to the patient  regarding the next appointment.  BI-RADS CATEGORY 2: Benign Finding(s)  Electronically Signed  By: Ulyess Blossom M.D.  On: 10/06/2013 11:44   Assessment    Painful right breast mass/cyst    Plan    The patient would like to have the area removed due to discomfort. Explained this may not help her breast pain. She still would like to proceed. Recommend right breast excisional biopsy.The procedure has been discussed with the patient.  Alternative therapies have been discussed with the patient.  Operative risks include bleeding,  Infection,  Organ injury,  Nerve injury,  Blood vessel injury,  DVT,  Pulmonary embolism,  Death,  And possible reoperation.  Medical management risks include worsening of present situation.  The success of the procedure is 50 -90 % at treating patients symptoms.  The patient understands and agrees to proceed.       Tadan Shill A. 11/08/2013, 2:41 PM

## 2013-12-02 NOTE — Discharge Instructions (Signed)
[facility] °Office Phone Number [phone] ° °BREAST BIOPSY/ PARTIAL MASTECTOMY: POST OP INSTRUCTIONS ° °Always review your discharge instruction sheet given to you by the facility where your surgery was performed. ° °IF YOU HAVE DISABILITY OR FAMILY LEAVE FORMS, YOU MUST BRING THEM TO THE OFFICE FOR PROCESSING.  DO NOT GIVE THEM TO YOUR DOCTOR. ° °1. A prescription for pain medication may be given to you upon discharge.  Take your pain medication as prescribed, if needed.  If narcotic pain medicine is not needed, then you may take acetaminophen (Tylenol) or ibuprofen (Advil) as needed. °2. Take your usually prescribed medications unless otherwise directed °3. If you need a refill on your pain medication, please contact your pharmacy.  They will contact our office to request authorization.  Prescriptions will not be filled after 5pm or on week-ends. °4. You should eat very light the first 24 hours after surgery, such as soup, crackers, pudding, etc.  Resume your normal diet the day after surgery. °5. Most patients will experience some swelling and bruising in the breast.  Ice packs and a good support bra will help.  Swelling and bruising can take several days to resolve.  °6. It is common to experience some constipation if taking pain medication after surgery.  Increasing fluid intake and taking a stool softener will usually help or prevent this problem from occurring.  A mild laxative (Milk of Magnesia or Miralax) should be taken according to package directions if there are no bowel movements after 48 hours. °7. Unless discharge instructions indicate otherwise, you may remove your bandages 24-48 hours after surgery, and you may shower at that time.  You may have steri-strips (small skin tapes) in place directly over the incision.  These strips should be left on the skin for 7-10 days.  If your surgeon used skin glue on the incision, you may shower in 24 hours.  The glue will flake off over the  next 2-3 weeks.  Any sutures or staples will be removed at the office during your follow-up visit. °8. ACTIVITIES:  You may resume regular daily activities (gradually increasing) beginning the next day.  Wearing a good support bra or sports bra minimizes pain and swelling.  You may have sexual intercourse when it is comfortable. °a. You may drive when you no longer are taking prescription pain medication, you can comfortably wear a seatbelt, and you can safely maneuver your car and apply brakes. °b. RETURN TO WORK:  ______________________________________________________________________________________ °9. You should see your doctor in the office for a follow-up appointment approximately two weeks after your surgery.  Your doctor’s nurse will typically make your follow-up appointment when she calls you with your pathology report.  Expect your pathology report 2-3 business days after your surgery.  You may call to check if you do not hear from us after three days. °10. OTHER INSTRUCTIONS: _______________________________________________________________________________________________ _____________________________________________________________________________________________________________________________________ °_____________________________________________________________________________________________________________________________________ °_____________________________________________________________________________________________________________________________________ ° °WHEN TO CALL YOUR DOCTOR: °1. Fever over 101.0 °2. Nausea and/or vomiting. °3. Extreme swelling or bruising. °4. Continued bleeding from incision. °5. Increased pain, redness, or drainage from the incision. ° °The clinic staff is available to answer your questions during regular business hours.  Please don’t hesitate to call and ask to speak to one of the nurses for clinical concerns.  If you have a medical emergency, go to the nearest  emergency room or call 911.  A surgeon from [facility] is always on call at the hospital. ° °For further questions, please visit [email or web address]  ° ° °  Post Anesthesia Home Care Instructions ° °Activity: °Get plenty of rest for the remainder of the day. A responsible adult should stay with you for 24 hours following the procedure.  °For the next 24 hours, DO NOT: °-Drive a car °-Operate machinery °-Drink alcoholic beverages °-Take any medication unless instructed by your physician °-Make any legal decisions or sign important papers. ° °Meals: °Start with liquid foods such as gelatin or soup. Progress to regular foods as tolerated. Avoid greasy, spicy, heavy foods. If nausea and/or vomiting occur, drink only clear liquids until the nausea and/or vomiting subsides. Call your physician if vomiting continues. ° °Special Instructions/Symptoms: °Your throat may feel dry or sore from the anesthesia or the breathing tube placed in your throat during surgery. If this causes discomfort, gargle with warm salt water. The discomfort should disappear within 24 hours. ° °

## 2013-12-02 NOTE — Anesthesia Procedure Notes (Signed)
Procedure Name: LMA Insertion Date/Time: 12/02/2013 8:53 AM Performed by: Lieutenant Diego Pre-anesthesia Checklist: Patient identified, Emergency Drugs available, Suction available and Patient being monitored Patient Re-evaluated:Patient Re-evaluated prior to inductionOxygen Delivery Method: Circle System Utilized Preoxygenation: Pre-oxygenation with 100% oxygen Intubation Type: IV induction Ventilation: Mask ventilation without difficulty LMA: LMA inserted LMA Size: 4.0 Number of attempts: 1 Airway Equipment and Method: bite block Placement Confirmation: positive ETCO2 and breath sounds checked- equal and bilateral Tube secured with: Tape Dental Injury: Teeth and Oropharynx as per pre-operative assessment

## 2013-12-02 NOTE — Anesthesia Postprocedure Evaluation (Signed)
  Anesthesia Post-op Note  Patient: Jade Kelly  Procedure(s) Performed: Procedure(s): RIGHT EXCISIONAL BREAST BIOPSY (Right)  Patient Location: PACU  Anesthesia Type:General  Level of Consciousness: awake, alert  and oriented  Airway and Oxygen Therapy: Patient Spontanous Breathing  Post-op Pain: mild  Post-op Assessment: Post-op Vital signs reviewed  Post-op Vital Signs: Reviewed  Complications: No apparent anesthesia complications

## 2013-12-02 NOTE — Transfer of Care (Signed)
Immediate Anesthesia Transfer of Care Note  Patient: Jade Kelly  Procedure(s) Performed: Procedure(s): RIGHT EXCISIONAL BREAST BIOPSY (Right)  Patient Location: PACU  Anesthesia Type:General  Level of Consciousness: awake and alert   Airway & Oxygen Therapy: Patient Spontanous Breathing and Patient connected to face mask oxygen  Post-op Assessment: Report given to PACU RN and Post -op Vital signs reviewed and stable  Post vital signs: Reviewed and stable  Complications: No apparent anesthesia complications

## 2013-12-03 ENCOUNTER — Encounter (HOSPITAL_BASED_OUTPATIENT_CLINIC_OR_DEPARTMENT_OTHER): Payer: Self-pay | Admitting: Surgery

## 2013-12-03 ENCOUNTER — Telehealth (INDEPENDENT_AMBULATORY_CARE_PROVIDER_SITE_OTHER): Payer: Self-pay | Admitting: *Deleted

## 2013-12-03 NOTE — Telephone Encounter (Signed)
Patient called to ask if there is a way to change her pain medication prescription due to the Percocet is causing her to twitch and be sexual aroused.  Spoke to Dr. Brantley Stage who wrote new prescription for [facility] for patient.  Patient updated and aware prescription is at the front desk ready for pickup.

## 2013-12-07 ENCOUNTER — Telehealth (INDEPENDENT_AMBULATORY_CARE_PROVIDER_SITE_OTHER): Payer: Self-pay | Admitting: *Deleted

## 2013-12-07 NOTE — Telephone Encounter (Signed)
Ms. Tavenner called to ask about the surg path results.  Explained that Dr. Brantley Stage had noted the results were benign.  She states understanding at this time.

## 2013-12-17 ENCOUNTER — Ambulatory Visit (INDEPENDENT_AMBULATORY_CARE_PROVIDER_SITE_OTHER): Payer: Medicaid Other | Admitting: Surgery

## 2013-12-17 ENCOUNTER — Encounter (INDEPENDENT_AMBULATORY_CARE_PROVIDER_SITE_OTHER): Payer: Self-pay | Admitting: Surgery

## 2013-12-17 VITALS — BP 122/80 | HR 84 | Temp 98.4°F | Resp 14 | Ht 67.0 in | Wt 187.4 lb

## 2013-12-17 DIAGNOSIS — Z9889 Other specified postprocedural states: Secondary | ICD-10-CM

## 2013-12-17 NOTE — Patient Instructions (Signed)
Fibrocystic Breast Changes Fibrocystic breast changes occur when breast ducts become blocked, causing painful, fluid-filled lumps (cysts) to form in the breast. This is a common condition that is noncancerous (benign). It occurs when women go through hormonal changes during their menstrual cycle. Fibrocystic breast changes can affect one or both breasts. CAUSES  The exact cause of fibrocystic breast changes is not known, but it may be related to the female hormones, estrogen and progesterone. Family traits that get passed from parent to child (genetics) may also be a factor in some cases. SIGNS AND SYMPTOMS   Tenderness, mild discomfort, or pain.   Swelling.   Ropelike feeling when touching the breast.   Lumpy breast, one or both sides.   Changes in breast size, especially before (larger) and after (smaller) the menstrual period.   Green or dark brown nipple discharge (not blood).  Symptoms are usually worse before menstrual periods start and get better toward the end of the menstrual period.  DIAGNOSIS  To make a diagnosis, your health care provider will ask you questions and perform a physical exam of your breasts. The health care provider may recommend other tests that can examine inside your breasts, such as:  A breast X-ray (mammogram).   Ultrasonography.  An MRI.  If something more than fibrocystic breast changes is suspected, your health care provider may take a breast tissue sample (breast biopsy) to examine. TREATMENT  Often, treatment is not needed. Your health care provider may recommend over-the-counter pain relievers to help lessen pain or discomfort caused by the fibrocystic breast changes. You may also be asked to change your diet to limit or stop eating foods or drinking beverages that contain caffeine. Foods and beverages that contain caffeine include chocolate, soda, coffee, and tea. Reducing sugar and fat in your diet may also help. Your health care provider  may also recommend:  Fine needle aspiration to remove fluid from a cyst that is causing pain.   Surgery to remove a large, persistent, and tender cyst. HOME CARE INSTRUCTIONS   Examine your breasts after every menstrual period. If you do not have menstrual periods, check your breasts the first day of every month. Feel for changes, such as more tenderness, a new growth, a change in breast size, or a change in a lump that has always been there.   Only take over-the-counter or prescription medicine as directed by your health care provider.   Wear a well-fitted support or sports bra, especially when exercising.   Decrease or avoid caffeine, fat, and sugar in your diet as directed by your health care provider.  SEEK MEDICAL CARE IF:   You have fluid leaking (discharge) from your nipples, especially bloody discharge.   You have new lumps or bumps in the breast.   Your breast or breasts become enlarged, red, and painful.   You have areas of your breast that pucker in.   Your nipples appear flat or indented.  Document Released: 08/07/2006 Document Revised: 06/23/2013 Document Reviewed: 04/11/2013 [employer] Patient Information 2014 [employer].  

## 2013-12-17 NOTE — Progress Notes (Signed)
Patient returned to her lumpectomy. She has no complaints.  Exam: Right breast incision clean dry and intact without signs of infection.  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 - NO HYPERPLASIA, ATYPIA, OR MALIGNANCY IDENTIFIED. Willeen Niece MD Pathologist, Electronic Signature    Impression: Status post right breast lumpectomy with fibrocystic change  Plan: Patient doing well. Resume full activity in one week. Information about fibrocystic breast disease given.follow Up PRN

## 2014-07-14 ENCOUNTER — Emergency Department (HOSPITAL_COMMUNITY)
Admission: EM | Admit: 2014-07-14 | Discharge: 2014-07-14 | Disposition: A | Discharge status: Home / Self Care | Payer: Medicaid Other | Attending: Emergency Medicine | Admitting: Emergency Medicine

## 2014-07-14 ENCOUNTER — Encounter (HOSPITAL_COMMUNITY): Payer: Self-pay | Admitting: Emergency Medicine

## 2014-07-14 DIAGNOSIS — Z8742 Personal history of other diseases of the female genital tract: Secondary | ICD-10-CM | POA: Insufficient documentation

## 2014-07-14 DIAGNOSIS — Z79899 Other long term (current) drug therapy: Secondary | ICD-10-CM | POA: Insufficient documentation

## 2014-07-14 DIAGNOSIS — Z880 Allergy status to penicillin: Secondary | ICD-10-CM | POA: Diagnosis not present

## 2014-07-14 DIAGNOSIS — F329 Major depressive disorder, single episode, unspecified: Secondary | ICD-10-CM | POA: Diagnosis not present

## 2014-07-14 DIAGNOSIS — R002 Palpitations: Secondary | ICD-10-CM | POA: Insufficient documentation

## 2014-07-14 DIAGNOSIS — F411 Generalized anxiety disorder: Secondary | ICD-10-CM | POA: Diagnosis not present

## 2014-07-14 DIAGNOSIS — F3289 Other specified depressive episodes: Secondary | ICD-10-CM | POA: Diagnosis not present

## 2014-07-14 DIAGNOSIS — J45909 Unspecified asthma, uncomplicated: Secondary | ICD-10-CM | POA: Diagnosis not present

## 2014-07-14 LAB — COMPREHENSIVE METABOLIC PANEL
ALBUMIN: 3.5 g/dL (ref 3.5–5.2)
ALK PHOS: 72 U/L (ref 39–117)
ALT: 29 U/L (ref 0–35)
ANION GAP: 11 (ref 5–15)
AST: 16 U/L (ref 0–37)
BUN: 13 mg/dL (ref 6–23)
CALCIUM: 9 mg/dL (ref 8.4–10.5)
CO2: 25 mEq/L (ref 19–32)
CREATININE: 0.76 mg/dL (ref 0.50–1.10)
Chloride: 105 mEq/L (ref 96–112)
GFR calc non Af Amer: >90 mL/min (ref >90)
GLUCOSE: 92 mg/dL (ref 70–99)
POTASSIUM: 4.3 meq/L (ref 3.7–5.3)
Sodium: 141 mEq/L (ref 137–147)
TOTAL PROTEIN: 7.2 g/dL (ref 6.0–8.3)
Total Bilirubin: 0.3 mg/dL (ref 0.3–1.2)

## 2014-07-14 LAB — URINALYSIS, ROUTINE W REFLEX MICROSCOPIC
BILIRUBIN URINE: NEGATIVE
Glucose, UA: NEGATIVE mg/dL
Hgb urine dipstick: NEGATIVE
KETONES UR: NEGATIVE mg/dL
LEUKOCYTES UA: NEGATIVE
NITRITE: NEGATIVE
Protein, ur: NEGATIVE mg/dL
SPECIFIC GRAVITY, URINE: 1.025 (ref 1.005–1.030)
UROBILINOGEN UA: 0.2 mg/dL (ref 0.0–1.0)
pH: 6.5 (ref 5.0–8.0)

## 2014-07-14 LAB — CBC WITH DIFFERENTIAL/PLATELET
Basophils Absolute: 0 10*3/uL (ref 0.0–0.1)
Basophils Relative: 0 % (ref 0–1)
EOS ABS: 0.1 10*3/uL (ref 0.0–0.7)
Eosinophils Relative: 2 % (ref 0–5)
HCT: 38.6 % (ref 36.0–46.0)
HEMOGLOBIN: 13.4 g/dL (ref 12.0–15.0)
Lymphocytes Relative: 31 % (ref 12–46)
Lymphs Abs: 2.3 10*3/uL (ref 0.7–4.0)
MCH: 28.6 pg (ref 26.0–34.0)
MCHC: 34.7 g/dL (ref 30.0–36.0)
MCV: 82.3 fL (ref 78.0–100.0)
MONOS PCT: 8 % (ref 3–12)
Monocytes Absolute: 0.6 10*3/uL (ref 0.1–1.0)
Neutro Abs: 4.3 10*3/uL (ref 1.7–7.7)
Neutrophils Relative %: 59 % (ref 43–77)
Platelets: 273 10*3/uL (ref 150–400)
RBC: 4.69 MIL/uL (ref 3.87–5.11)
RDW: 12.4 % (ref 11.5–15.5)
WBC: 7.4 10*3/uL (ref 4.0–10.5)

## 2014-07-14 NOTE — ED Provider Notes (Signed)
CSN: [id number]     Arrival date & time 07/14/14  0254 History  This chart was scribed for Nat Christen, MD by Ludger Nutting, ED Scribe. This patient was seen in room APA14/APA14 and the patient's care was started 7:16 AM.    Chief Complaint  Patient presents with  . Palpitations   The history is provided by the patient. No language interpreter was used.    HPI Comments: Jade Kelly is a 28 y.o. female who presents to the Emergency Department complaining of palpitations that for about 20 seconds, occurring about 2 hours ago. Patient's grandmother states patient's HR was fluctuating between 95-120 bpm. She denies similar symptoms in the past. She denies taking new medications or using OTC medications. Patient states she drinks several cans of mountain dew in addition to tea each day. She denies chest pain, SOB.   PCP Kelton Pillar   Past Medical History  Diagnosis Date  . Depression   . Anxiety   . Asthma     prn inhaler  . Breast mass, right 11/2013  . Family history of anesthesia complication     maternal grandmother has hx. of post-op nausea   Past Surgical History  Procedure Laterality Date  . Wisdom tooth extraction    . Excision of breast biopsy Right 12/02/2013    Procedure: RIGHT EXCISIONAL BREAST BIOPSY;  Surgeon: Marcello Moores A. Cornett, MD;  Location: [facility];  Service: General;  Laterality: Right;   Family History  Problem Relation Age of Onset  . Anesthesia problems Maternal Grandmother     post-op nausea   History  Substance Use Topics  . Smoking status: Never Smoker   . Smokeless tobacco: Never Used  . Alcohol Use: No   OB History   Grav Para Term Preterm Abortions TAB SAB Ect Mult Living                 Review of Systems  A complete 10 system review of systems was obtained and all systems are negative except as noted in the HPI and PMH.    Allergies  Penicillins  Home Medications   Prior to Admission medications   Medication Sig Start  Date End Date Taking? Authorizing Provider  albuterol (PROAIR HFA) 108 (90 BASE) MCG/ACT inhaler Inhale 1 puff into the lungs every 6 (six) hours as needed for wheezing or shortness of breath.    Yes Historical Provider, MD  buPROPion (WELLBUTRIN XL) 150 MG 24 hr tablet Take 150 mg by mouth daily.   Yes Historical Provider, MD  FLUoxetine (PROZAC) 20 MG capsule Take 60 mg by mouth daily.   Yes Historical Provider, MD  ibuprofen (ADVIL,MOTRIN) 100 MG tablet Take 100 mg by mouth every 6 (six) hours as needed for pain or fever.    Yes Historical Provider, MD  norethindrone-ethinyl estradiol (JUNEL FE,GILDESS FE,LOESTRIN FE) 1-20 MG-MCG tablet Take 1 tablet by mouth daily.   Yes Historical Provider, MD   BP 113/80  Pulse 79  Temp(Src) 98.7 F (37.1 C)  Resp 16  Ht 5\' 7"  (1.702 m)  Wt 187 lb (84.823 kg)  BMI 29.28 kg/m2  SpO2 98%  LMP 06/23/2014 Physical Exam  Nursing note and vitals reviewed. Constitutional: She is oriented to person, place, and time. She appears well-developed and well-nourished.  HENT:  Head: Normocephalic and atraumatic.  Eyes: Conjunctivae and EOM are normal. Pupils are equal, round, and reactive to light.  Neck: Normal range of motion. Neck supple.  Cardiovascular: Normal rate,  regular rhythm and normal heart sounds.   Pulmonary/Chest: Effort normal and breath sounds normal.  Abdominal: Soft. Bowel sounds are normal.  Musculoskeletal: Normal range of motion.  Neurological: She is alert and oriented to person, place, and time.  Skin: Skin is warm and dry.  Psychiatric: She has a normal mood and affect. Her behavior is normal.    ED Course  Procedures (including critical care time)  DIAGNOSTIC STUDIES: Oxygen Saturation is 98% on RA, normal by my interpretation.    COORDINATION OF CARE: 7:22 AM Will order lab work and EKG. Discussed treatment plan with pt at bedside and pt agreed to plan.   Labs Review Labs Reviewed  CBC WITH DIFFERENTIAL  COMPREHENSIVE  METABOLIC PANEL  URINALYSIS, ROUTINE W REFLEX MICROSCOPIC    Imaging Review No results found.   EKG Interpretation   Date/Time:  Thursday July 14 2014 06:44:13 EDT Ventricular Rate:  83 PR Interval:  146 QRS Duration: 88 QT Interval:  379 QTC Calculation: 445 R Axis:   54 Text Interpretation:  Sinus rhythm Confirmed by Hamda Klutts  MD, Kindall Swaby ([id number]) on  07/14/2014 9:09:40 AM      MDM   Final diagnoses:  Palpitations    Patient is low risk for ACS or pulmonary embolism. EKG normal. CBC, Cmet, urinalysis normal.  I personally performed the services described in this documentation, which was scribed in my presence. The recorded information has been reviewed and is accurate.   Nat Christen, MD 07/14/14 [phone]

## 2014-07-14 NOTE — ED Notes (Signed)
Pt states she woke up and it felt like her heart was skipping beats or beating fast.

## 2014-07-14 NOTE — Discharge Instructions (Signed)
Tests were completely normal. Decrease Mountain Dew consumption.  Followup your Dr.

## 2015-12-07 ENCOUNTER — Other Ambulatory Visit: Payer: Self-pay | Admitting: Family Medicine

## 2015-12-07 ENCOUNTER — Ambulatory Visit
Admission: RE | Admit: 2015-12-07 | Discharge: 2015-12-07 | Disposition: A | Discharge status: Home / Self Care | Payer: Medicaid Other | Source: Ambulatory Visit | Attending: Family Medicine | Admitting: Family Medicine

## 2015-12-07 DIAGNOSIS — S9032XA Contusion of left foot, initial encounter: Secondary | ICD-10-CM

## 2018-09-28 ENCOUNTER — Other Ambulatory Visit: Payer: Self-pay | Admitting: Family Medicine

## 2018-09-28 DIAGNOSIS — N6310 Unspecified lump in the right breast, unspecified quadrant: Secondary | ICD-10-CM

## 2018-10-06 ENCOUNTER — Ambulatory Visit
Admission: RE | Admit: 2018-10-06 | Discharge: 2018-10-06 | Disposition: A | Discharge status: Home / Self Care | Payer: Medicaid Other | Source: Ambulatory Visit | Attending: Family Medicine | Admitting: Family Medicine

## 2018-10-06 ENCOUNTER — Other Ambulatory Visit: Payer: Self-pay | Admitting: Family Medicine

## 2018-10-06 DIAGNOSIS — N6310 Unspecified lump in the right breast, unspecified quadrant: Secondary | ICD-10-CM

## 2018-10-09 ENCOUNTER — Other Ambulatory Visit: Payer: Self-pay | Admitting: Family Medicine

## 2018-10-09 ENCOUNTER — Ambulatory Visit
Admission: RE | Admit: 2018-10-09 | Discharge: 2018-10-09 | Disposition: A | Discharge status: Home / Self Care | Payer: Medicaid Other | Source: Ambulatory Visit | Attending: Family Medicine | Admitting: Family Medicine

## 2018-10-09 DIAGNOSIS — N6310 Unspecified lump in the right breast, unspecified quadrant: Secondary | ICD-10-CM

## 2018-10-26 ENCOUNTER — Ambulatory Visit: Payer: Self-pay | Admitting: Surgery

## 2018-10-26 ENCOUNTER — Other Ambulatory Visit: Payer: Self-pay | Admitting: Surgery

## 2018-10-26 DIAGNOSIS — D241 Benign neoplasm of right breast: Secondary | ICD-10-CM

## 2018-10-26 DIAGNOSIS — N6310 Unspecified lump in the right breast, unspecified quadrant: Secondary | ICD-10-CM

## 2018-10-26 NOTE — H&P (Signed)
Jade Kelly Documented: 10/26/2018 11:14 AM Location: [facility] Patient #: [id number] DOB: [birth year] Single / Language: Jade Kelly / Race: White Female  History of Present Illness Jade Moores [name] MD; 10/26/2018 12:20 PM) Patient words: Patient sent at the request of Dr. Ammie Kelly for right breast mass and pain. The patient has a history of a right breast lumpectomy for fibroadenoma in 2015. She is developed pain behind her right nipple. Workup was done which includes mammography and ultrasound which showed a clumped area of retroareolar masses core biopsy-proven to be fibroadenoma. A second area at 10:00 was biopsied and this was fibrocystic change. The pain is beginning to impact her quality of life. Location is behind the right nipple. Left breast is normal. She has no discharge. Patient denies any fever or chills.               Diagnosis 1. Breast, right, needle core biopsy, retroareolar - FIBROADENOMA. 2. Breast, right, needle core biopsy, 10 o'clock - FIBROCYSTIC CHANGES. Microscopic Comment 1. The results were reported to [facility] on 10/12/2018. Intradepartmental consultation was obtained (Dr. Lyndon Kelly - 2). Jade Manners MD Pathologist, Electronic Signature (Case signed 10/12/2018) Specimen Gross and Clinical Information Specimen Comment 1. TIF: 421 PM; extracted < 5 min; enlarging palpable retroareolar lump with pain 2. TIF: 431 PM; extracted < 5 min; incidental mass in upper outer right breast Specimen(s) Obtained: 1. Breast, right, needle core biopsy, retroareolar 2. Breast, right, needle core biopsy, 10 o'clock Specimen Clinical.  The patient is a 32 year old female.   Past Surgical History Jade Kelly, CMA; 10/26/2018 11:15 AM) Breast Biopsy Right. Oral Surgery  Diagnostic Studies History Jade Kelly, CMA; 10/26/2018 11:15 AM) Colonoscopy never Mammogram within last year Pap  Smear 1-5 years ago  Allergies Jade Kelly, CMA; 10/26/2018 11:15 AM) Penicillin G Pot in Dextrose *PENICILLINS*  Medication History (Jade Kelly, CMA; 10/26/2018 11:16 AM) Doxycycline Hyclate (100MG  Tablet, Oral prn) Active. ALPRAZolam (0.25MG  Tablet, Oral prn) Active. buPROPion HCl ER (SR) (150MG  Tablet ER 12HR, Oral) Active. FLUoxetine HCl (60MG  Tablet, Oral) Active. ProAir HFA (108 (90 Base)MCG/ACT Aerosol Soln, Inhalation) Active. Medications Reconciled  Social History Jade Kelly, CMA; 10/26/2018 11:15 AM) Caffeine use Coffee, Tea. No alcohol use No drug use Tobacco use Never smoker.  Family History Jade Kelly, CMA; 10/26/2018 11:15 AM) Alcohol Abuse Family Members In General. Anesthetic complications Family Members In General. Cerebrovascular Accident Family Members In General. Colon Polyps Family Members In General. Diabetes Mellitus Family Members In General, Mother. Heart Disease Mother. Heart disease in female family member before age 31 Hypertension Family Members In General, Mother. Kidney Disease Mother.  Pregnancy / Birth History Jade Lull R. Rolena Infante, CMA; 10/26/2018 11:15 AM) Age at menarche 38 years. Contraceptive History Oral contraceptives. Gravida 0 Para 0 Regular periods  Other Problems Jade Kelly, CMA; 10/26/2018 11:15 AM) Anxiety Disorder Asthma Depression Lump In Breast     Review of Systems Jade Kelly CMA; 10/26/2018 11:15 AM) General Not Present- Appetite Loss, Chills, Fatigue, Fever, Night Sweats, Weight Gain and Weight Loss. Skin Not Present- Change in Wart/Mole, Dryness, Hives, Jaundice, New Lesions, Non-Healing Wounds, Rash and Ulcer. HEENT Not Present- Earache, Hearing Loss, Hoarseness, Nose Bleed, Oral Ulcers, Ringing in the Ears, Seasonal Allergies, Sinus Pain, Sore Throat, Visual Disturbances, Wears glasses/contact lenses and Yellow Eyes. Respiratory Not  Present- Bloody sputum, Chronic Cough, Difficulty Breathing, Snoring and Wheezing. Breast Present- Breast Mass and Breast Pain. Not Present-  Nipple Discharge and Skin Changes. Cardiovascular Not Present- Chest Pain, Difficulty Breathing Lying Down, Leg Cramps, Palpitations, Rapid Heart Rate, Shortness of Breath and Swelling of Extremities. Gastrointestinal Not Present- Abdominal Pain, Bloating, Bloody Stool, Change in Bowel Habits, Chronic diarrhea, Constipation, Difficulty Swallowing, Excessive gas, Gets full quickly at meals, Hemorrhoids, Indigestion, Nausea, Rectal Pain and Vomiting. Female Genitourinary Not Present- Frequency, Nocturia, Painful Urination, Pelvic Pain and Urgency. Musculoskeletal Not Present- Back Pain, Joint Pain, Joint Stiffness, Muscle Pain, Muscle Weakness and Swelling of Extremities. Neurological Not Present- Decreased Memory, Fainting, Headaches, Numbness, Seizures, Tingling, Tremor, Trouble walking and Weakness. Psychiatric Present- Anxiety and Depression. Not Present- Bipolar, Change in Sleep Pattern, Fearful and Frequent crying. Endocrine Not Present- Cold Intolerance, Excessive Hunger, Hair Changes, Heat Intolerance, Hot flashes and New Diabetes. Hematology Not Present- Blood Thinners, Easy Bruising, Excessive bleeding, Gland problems, HIV and Persistent Infections.  Vitals Jade Kelly CMA; 10/26/2018 11:15 AM) 10/26/2018 11:14 AM Weight: 189.38 lb Height: 67in Body Surface Area: 1.98 m Body Mass Index: 29.66 kg/m  Pulse: 97 (Regular)  BP: 114/70 (Sitting, Left Arm, Standard)      Physical Exam (Jade Nicastro A. Rashaun Wichert MD; 10/26/2018 12:20 PM)  General Mental Status-Alert. General Appearance-Consistent with stated age. Hydration-Well hydrated. Voice-Normal.  Head and Neck Head-normocephalic, atraumatic with no lesions or palpable masses. Trachea-midline. Thyroid Gland Characteristics - normal size and  consistency.  Eye Eyeball - Bilateral-Extraocular movements intact. Sclera/Conjunctiva - Bilateral-No scleral icterus.  Chest and Lung Exam Chest and lung exam reveals -quiet, even and easy respiratory effort with no use of accessory muscles and on auscultation, normal breath sounds, no adventitious sounds and normal vocal resonance. Inspection Chest Wall - Normal. Back - normal.  Breast Breast - Left-Symmetric, Non Tender, No Biopsy scars, no Dimpling, No Inflammation, No Lumpectomy scars, No Mastectomy scars, No Peau d' Orange. Breast - Right-Symmetric, Non Tender, No Biopsy scars, no Dimpling, No Inflammation, No Lumpectomy scars, No Mastectomy scars, No Peau d' Orange. Breast Lump-No Palpable Breast Mass.  Cardiovascular Cardiovascular examination reveals -normal heart sounds, regular rate and rhythm with no murmurs and normal pedal pulses bilaterally.  Neurologic Neurologic evaluation reveals -alert and oriented x 3 with no impairment of recent or remote memory. Mental Status-Normal.  Lymphatic Head & Neck  General Head & Neck Lymphatics: Bilateral - Description - Normal. Axillary  General Axillary Region: Bilateral - Description - Normal. Tenderness - Non Tender.    Assessment & Plan (Akaysha Cobern A. Ervine Witucki MD; 10/26/2018 12:22 PM)  BREAST FIBROADENOMA, RIGHT (D24.1) Impression: The patient desires excision due to pain. Discussed right breast seed localized lumpectomy. Risk of lumpectomy include bleeding, infection, seroma, more surgery, use of seed/wire, wound care, cosmetic deformity and the need for other treatments, death , blood clots, death. Pt agrees to proceed.  Current Plans Pt Education - [facility] Breast Pains Education Pt Education - [facility] Breast Biopsy HCI: discussed with patient and provided information.

## 2018-11-18 ENCOUNTER — Encounter (HOSPITAL_COMMUNITY): Payer: Self-pay

## 2018-11-18 NOTE — Pre-Procedure Instructions (Signed)
Jade Kelly  11/18/2018      [facility], [address] [place] [address] Phone: [phone] Fax: [phone]    Your procedure is scheduled on Thursday January 23rd.  Report to [facility] at 0830 A.M.  Call this number if you have problems the morning of surgery:  [phone]   Remember:  Do not eat after midnight.  You may drink clear liquids until 0730am .  Clear liquids allowed are:                    Water, Juice (non-citric and without pulp), Carbonated beverages, Clear Tea, Black Coffee only and Gatorade    Take these medicines the morning of surgery with A SIP OF WATER  albuterol (PROAIR HFA) if needed. Bring inhaler with you to surgery ALPRAZolam Duanne Moron) if needed buPROPion (WELLBUTRIN SR)  FLUoxetine (PROZAC)  7 days prior to surgery STOP taking any Aspirin (unless otherwise instructed by your surgeon), Aleve, Naproxen, Ibuprofen, Motrin, Advil, Goody's, BC's, all herbal medications, fish oil, and all vitamins.     Do not wear jewelry, make-up or nail polish.  Do not wear lotions, powders, or perfumes, or deodorant.  Do not shave 48 hours prior to surgery.  Men may shave face and neck.  Do not bring valuables to the hospital.  [facility] is not responsible for any belongings or valuables.  Contacts, dentures or bridgework may not be worn into surgery.  Leave your suitcase in the car.  After surgery it may be brought to your room.  For patients admitted to the hospital, discharge time will be determined by your treatment team.  Patients discharged the day of surgery will not be allowed to drive home.    [facility]- Preparing For Surgery  Before surgery, you can play an important role. Because skin is not sterile, your skin needs to be as free of germs as possible. You can reduce the number of germs on your skin by washing with CHG (chlorahexidine gluconate) Soap before surgery.  CHG is  an antiseptic cleaner which kills germs and bonds with the skin to continue killing germs even after washing.    Oral Hygiene is also important to reduce your risk of infection.  Remember - BRUSH YOUR TEETH THE MORNING OF SURGERY WITH YOUR REGULAR TOOTHPASTE  Please do not use if you have an allergy to CHG or antibacterial soaps. If your skin becomes reddened/irritated stop using the CHG.  Do not shave (including legs and underarms) for at least 48 hours prior to first CHG shower. It is OK to shave your face.  Please follow these instructions carefully.   1. Shower the NIGHT BEFORE SURGERY and the MORNING OF SURGERY with CHG.   2. If you chose to wash your hair, wash your hair first as usual with your normal shampoo.  3. After you shampoo, rinse your hair and body thoroughly to remove the shampoo.  4. Use CHG as you would any other liquid soap. You can apply CHG directly to the skin and wash gently with a scrungie or a clean washcloth.   5. Apply the CHG Soap to your body ONLY FROM THE NECK DOWN.  Do not use on open wounds or open sores. Avoid contact with your eyes, ears, mouth and genitals (private parts). Wash Face and genitals (private parts)  with your normal soap.  6. Wash thoroughly, paying special attention to  the area where your surgery will be performed.  7. Thoroughly rinse your body with warm water from the neck down.  8. DO NOT shower/wash with your normal soap after using and rinsing off the CHG Soap.  9. Pat yourself dry with a CLEAN TOWEL.  10. Wear CLEAN PAJAMAS to bed the night before surgery, wear comfortable clothes the morning of surgery  11. Place CLEAN SHEETS on your bed the night of your first shower and DO NOT SLEEP WITH PETS.    Day of Surgery:  Do not apply any deodorants/lotions.  Please wear clean clothes to the hospital/surgery center.   Remember to brush your teeth WITH YOUR REGULAR TOOTHPASTE.    Please read over the following fact sheets that  you were given.

## 2018-11-19 ENCOUNTER — Encounter (HOSPITAL_COMMUNITY)
Admission: RE | Admit: 2018-11-19 | Discharge: 2018-11-19 | Disposition: A | Discharge status: Home / Self Care | Payer: Medicaid Other | Source: Ambulatory Visit | Attending: Surgery | Admitting: Surgery

## 2018-11-19 ENCOUNTER — Other Ambulatory Visit: Payer: Self-pay

## 2018-11-19 ENCOUNTER — Encounter (HOSPITAL_COMMUNITY): Payer: Self-pay

## 2018-11-19 DIAGNOSIS — Z01812 Encounter for preprocedural laboratory examination: Secondary | ICD-10-CM | POA: Diagnosis present

## 2018-11-19 HISTORY — DX: Gastro-esophageal reflux disease without esophagitis: K21.9

## 2018-11-19 LAB — CBC
HCT: 42.5 % (ref 36.0–46.0)
Hemoglobin: 13.7 g/dL (ref 12.0–15.0)
MCH: 27.6 pg (ref 26.0–34.0)
MCHC: 32.2 g/dL (ref 30.0–36.0)
MCV: 85.7 fL (ref 80.0–100.0)
NRBC: 0 % (ref 0.0–0.2)
Platelets: 329 10*3/uL (ref 150–400)
RBC: 4.96 MIL/uL (ref 3.87–5.11)
RDW: 11.9 % (ref 11.5–15.5)
WBC: 7.2 10*3/uL (ref 4.0–10.5)

## 2018-11-19 NOTE — Progress Notes (Signed)
PCP -  Kelton Pillar MD  Chest x-ray - N/A EKG - N/A  Blood Thinner Instructions:N/A Aspirin Instructions:N/A  Anesthesia review: N/A  Patient denies shortness of breath, fever, cough and chest pain at PAT appointment   Patient verbalized understanding of instructions that were given to them at the PAT appointment. Patient was also instructed that they will need to review over the PAT instructions again at home before surgery.

## 2018-11-25 ENCOUNTER — Ambulatory Visit
Admission: RE | Admit: 2018-11-25 | Discharge: 2018-11-25 | Disposition: A | Discharge status: Home / Self Care | Payer: Medicaid Other | Source: Ambulatory Visit | Attending: Surgery | Admitting: Surgery

## 2018-11-25 DIAGNOSIS — N6310 Unspecified lump in the right breast, unspecified quadrant: Secondary | ICD-10-CM

## 2018-11-26 ENCOUNTER — Other Ambulatory Visit: Payer: Self-pay

## 2018-11-26 ENCOUNTER — Ambulatory Visit (HOSPITAL_COMMUNITY): Payer: Medicaid Other | Admitting: Certified Registered Nurse Anesthetist

## 2018-11-26 ENCOUNTER — Encounter (HOSPITAL_COMMUNITY): Payer: Self-pay | Admitting: *Deleted

## 2018-11-26 ENCOUNTER — Encounter (HOSPITAL_COMMUNITY)
Admission: RE | Disposition: A | Discharge status: Home / Self Care | Payer: Self-pay | Source: Home / Self Care | Attending: Surgery

## 2018-11-26 ENCOUNTER — Ambulatory Visit
Admission: RE | Admit: 2018-11-26 | Discharge: 2018-11-26 | Disposition: A | Discharge status: Home / Self Care | Payer: Medicaid Other | Source: Ambulatory Visit | Attending: Surgery | Admitting: Surgery

## 2018-11-26 ENCOUNTER — Ambulatory Visit (HOSPITAL_COMMUNITY)
Admission: RE | Admit: 2018-11-26 | Discharge: 2018-11-26 | Disposition: A | Discharge status: Home / Self Care | Payer: Medicaid Other | Attending: Surgery | Admitting: Surgery

## 2018-11-26 DIAGNOSIS — D241 Benign neoplasm of right breast: Secondary | ICD-10-CM | POA: Diagnosis present

## 2018-11-26 DIAGNOSIS — F419 Anxiety disorder, unspecified: Secondary | ICD-10-CM | POA: Insufficient documentation

## 2018-11-26 DIAGNOSIS — F329 Major depressive disorder, single episode, unspecified: Secondary | ICD-10-CM | POA: Diagnosis not present

## 2018-11-26 DIAGNOSIS — N6310 Unspecified lump in the right breast, unspecified quadrant: Secondary | ICD-10-CM

## 2018-11-26 DIAGNOSIS — Z79899 Other long term (current) drug therapy: Secondary | ICD-10-CM | POA: Diagnosis not present

## 2018-11-26 DIAGNOSIS — J45909 Unspecified asthma, uncomplicated: Secondary | ICD-10-CM | POA: Diagnosis not present

## 2018-11-26 HISTORY — PX: BREAST LUMPECTOMY WITH RADIOACTIVE SEED LOCALIZATION: SHX6424

## 2018-11-26 LAB — POCT PREGNANCY, URINE: PREG TEST UR: NEGATIVE

## 2018-11-26 SURGERY — BREAST LUMPECTOMY WITH RADIOACTIVE SEED LOCALIZATION
Anesthesia: General | Site: Breast | Laterality: Right

## 2018-11-26 MED ORDER — GABAPENTIN 300 MG PO CAPS
ORAL_CAPSULE | ORAL | Status: AC
  Administered 2018-11-26: 300 mg via ORAL
  Filled 2018-11-26: qty 1

## 2018-11-26 MED ORDER — LIDOCAINE 2% (20 MG/ML) 5 ML SYRINGE
INTRAMUSCULAR | Status: DC | PRN
  Administered 2018-11-26: 40 mg via INTRAVENOUS

## 2018-11-26 MED ORDER — ONDANSETRON HCL 4 MG/2ML IJ SOLN
INTRAMUSCULAR | Status: AC
  Filled 2018-11-26: qty 2

## 2018-11-26 MED ORDER — FENTANYL CITRATE (PF) 100 MCG/2ML IJ SOLN: 25.0000 ug | INTRAMUSCULAR | Status: DC | PRN

## 2018-11-26 MED ORDER — FENTANYL CITRATE (PF) 250 MCG/5ML IJ SOLN
INTRAMUSCULAR | Status: DC | PRN
  Administered 2018-11-26: 50 ug via INTRAVENOUS

## 2018-11-26 MED ORDER — MIDAZOLAM HCL 2 MG/2ML IJ SOLN
INTRAMUSCULAR | Status: AC
  Filled 2018-11-26: qty 2

## 2018-11-26 MED ORDER — DIPHENHYDRAMINE HCL 50 MG/ML IJ SOLN
INTRAMUSCULAR | Status: AC
  Filled 2018-11-26: qty 1

## 2018-11-26 MED ORDER — HYDROCODONE-ACETAMINOPHEN 5-325 MG PO TABS: 1.0000 | ORAL_TABLET | Freq: Four times a day (QID) | ORAL | 0 refills | Status: DC | PRN

## 2018-11-26 MED ORDER — ACETAMINOPHEN 160 MG/5ML PO SOLN: 325.0000 mg | ORAL | Status: DC | PRN

## 2018-11-26 MED ORDER — IBUPROFEN 800 MG PO TABS: 800.0000 mg | ORAL_TABLET | Freq: Three times a day (TID) | ORAL | 0 refills | Status: DC | PRN

## 2018-11-26 MED ORDER — DEXAMETHASONE SODIUM PHOSPHATE 10 MG/ML IJ SOLN
INTRAMUSCULAR | Status: AC
  Filled 2018-11-26: qty 1

## 2018-11-26 MED ORDER — CELECOXIB 200 MG PO CAPS
200.0000 mg | ORAL_CAPSULE | ORAL | Status: AC
  Administered 2018-11-26: 200 mg via ORAL

## 2018-11-26 MED ORDER — CELECOXIB 200 MG PO CAPS
ORAL_CAPSULE | ORAL | Status: AC
  Administered 2018-11-26: 200 mg via ORAL
  Filled 2018-11-26: qty 1

## 2018-11-26 MED ORDER — DIPHENHYDRAMINE HCL 50 MG/ML IJ SOLN
INTRAMUSCULAR | Status: DC | PRN
  Administered 2018-11-26: 25 mg via INTRAVENOUS

## 2018-11-26 MED ORDER — MEPERIDINE HCL 50 MG/ML IJ SOLN
6.2500 mg | INTRAMUSCULAR | Status: DC | PRN
  Administered 2018-11-26: 12.5 mg via INTRAVENOUS

## 2018-11-26 MED ORDER — ONDANSETRON HCL 4 MG/2ML IJ SOLN
INTRAMUSCULAR | Status: DC | PRN
  Administered 2018-11-26: 4 mg via INTRAVENOUS

## 2018-11-26 MED ORDER — LIDOCAINE 2% (20 MG/ML) 5 ML SYRINGE
INTRAMUSCULAR | Status: AC
  Filled 2018-11-26: qty 5

## 2018-11-26 MED ORDER — DEXAMETHASONE SODIUM PHOSPHATE 10 MG/ML IJ SOLN
INTRAMUSCULAR | Status: DC | PRN
  Administered 2018-11-26: 10 mg via INTRAVENOUS

## 2018-11-26 MED ORDER — ONDANSETRON HCL 4 MG/2ML IJ SOLN: 4.0000 mg | Freq: Once | INTRAMUSCULAR | Status: DC | PRN

## 2018-11-26 MED ORDER — OXYCODONE HCL 5 MG PO TABS: 5.0000 mg | ORAL_TABLET | Freq: Once | ORAL | Status: DC | PRN

## 2018-11-26 MED ORDER — CHLORHEXIDINE GLUCONATE CLOTH 2 % EX PADS: 6.0000 | MEDICATED_PAD | Freq: Once | CUTANEOUS | Status: DC

## 2018-11-26 MED ORDER — CLINDAMYCIN PHOSPHATE 900 MG/50ML IV SOLN: 900.0000 mg | INTRAVENOUS | Status: DC

## 2018-11-26 MED ORDER — CLINDAMYCIN PHOSPHATE 900 MG/50ML IV SOLN
INTRAVENOUS | Status: AC
  Filled 2018-11-26: qty 50

## 2018-11-26 MED ORDER — CLINDAMYCIN PHOSPHATE 900 MG/50ML IV SOLN
900.0000 mg | INTRAVENOUS | Status: AC
  Administered 2018-11-26: 900 mg via INTRAVENOUS

## 2018-11-26 MED ORDER — LACTATED RINGERS IV SOLN
INTRAVENOUS | Status: DC
  Administered 2018-11-26: 09:00:00 via INTRAVENOUS

## 2018-11-26 MED ORDER — MIDAZOLAM HCL 5 MG/5ML IJ SOLN
INTRAMUSCULAR | Status: DC | PRN
  Administered 2018-11-26: 2 mg via INTRAVENOUS

## 2018-11-26 MED ORDER — PROPOFOL 10 MG/ML IV BOLUS
INTRAVENOUS | Status: DC | PRN
  Administered 2018-11-26: 200 mg via INTRAVENOUS

## 2018-11-26 MED ORDER — ACETAMINOPHEN 325 MG PO TABS: 325.0000 mg | ORAL_TABLET | ORAL | Status: DC | PRN

## 2018-11-26 MED ORDER — BUPIVACAINE-EPINEPHRINE (PF) 0.25% -1:200000 IJ SOLN
INTRAMUSCULAR | Status: AC
  Filled 2018-11-26: qty 30

## 2018-11-26 MED ORDER — ACETAMINOPHEN 500 MG PO TABS
ORAL_TABLET | ORAL | Status: AC
  Administered 2018-11-26: 1000 mg via ORAL
  Filled 2018-11-26: qty 2

## 2018-11-26 MED ORDER — ACETAMINOPHEN 500 MG PO TABS
1000.0000 mg | ORAL_TABLET | ORAL | Status: AC
  Administered 2018-11-26: 1000 mg via ORAL

## 2018-11-26 MED ORDER — FENTANYL CITRATE (PF) 250 MCG/5ML IJ SOLN
INTRAMUSCULAR | Status: AC
  Filled 2018-11-26: qty 5

## 2018-11-26 MED ORDER — PROPOFOL 10 MG/ML IV BOLUS
INTRAVENOUS | Status: AC
  Filled 2018-11-26: qty 20

## 2018-11-26 MED ORDER — 0.9 % SODIUM CHLORIDE (POUR BTL) OPTIME
TOPICAL | Status: DC | PRN
  Administered 2018-11-26: 1000 mL

## 2018-11-26 MED ORDER — BUPIVACAINE-EPINEPHRINE 0.25% -1:200000 IJ SOLN
INTRAMUSCULAR | Status: DC | PRN
  Administered 2018-11-26: 20 mL

## 2018-11-26 MED ORDER — GABAPENTIN 300 MG PO CAPS
300.0000 mg | ORAL_CAPSULE | ORAL | Status: AC
  Administered 2018-11-26: 300 mg via ORAL

## 2018-11-26 MED ORDER — MEPERIDINE HCL 50 MG/ML IJ SOLN
INTRAMUSCULAR | Status: AC
  Filled 2018-11-26: qty 1

## 2018-11-26 MED ORDER — OXYCODONE HCL 5 MG/5ML PO SOLN: 5.0000 mg | Freq: Once | ORAL | Status: DC | PRN

## 2018-11-26 SURGICAL SUPPLY — 36 items
APPLIER CLIP 9.375 MED OPEN (MISCELLANEOUS)
BINDER BREAST LRG (GAUZE/BANDAGES/DRESSINGS) IMPLANT
BINDER BREAST XLRG (GAUZE/BANDAGES/DRESSINGS) ×3 IMPLANT
CANISTER SUCT 3000ML PPV (MISCELLANEOUS) IMPLANT
CHLORAPREP W/TINT 26ML (MISCELLANEOUS) ×3 IMPLANT
CLIP APPLIE 9.375 MED OPEN (MISCELLANEOUS) IMPLANT
COVER PROBE W GEL 5X96 (DRAPES) ×3 IMPLANT
COVER SURGICAL LIGHT HANDLE (MISCELLANEOUS) ×3 IMPLANT
COVER WAND RF STERILE (DRAPES) ×3 IMPLANT
DERMABOND ADVANCED (GAUZE/BANDAGES/DRESSINGS) ×2
DERMABOND ADVANCED .7 DNX12 (GAUZE/BANDAGES/DRESSINGS) ×1 IMPLANT
DEVICE DUBIN SPECIMEN MAMMOGRA (MISCELLANEOUS) ×3 IMPLANT
DRAPE CHEST BREAST 15X10 FENES (DRAPES) ×3 IMPLANT
ELECT REM PT RETURN 9FT ADLT (ELECTROSURGICAL) ×3
ELECTRODE REM PT RTRN 9FT ADLT (ELECTROSURGICAL) ×1 IMPLANT
GLOVE BIO SURGEON STRL SZ8 (GLOVE) ×3 IMPLANT
GLOVE BIOGEL PI IND STRL 8 (GLOVE) ×1 IMPLANT
GLOVE BIOGEL PI INDICATOR 8 (GLOVE) ×2
GOWN STRL REUS W/ TWL LRG LVL3 (GOWN DISPOSABLE) ×1 IMPLANT
GOWN STRL REUS W/ TWL XL LVL3 (GOWN DISPOSABLE) ×1 IMPLANT
GOWN STRL REUS W/TWL LRG LVL3 (GOWN DISPOSABLE) ×2
GOWN STRL REUS W/TWL XL LVL3 (GOWN DISPOSABLE) ×2
KIT BASIN OR (CUSTOM PROCEDURE TRAY) ×3 IMPLANT
KIT MARKER MARGIN INK (KITS) IMPLANT
LIGHT WAVEGUIDE WIDE FLAT (MISCELLANEOUS) IMPLANT
NEEDLE HYPO 25GX1X1/2 BEV (NEEDLE) IMPLANT
NS IRRIG 1000ML POUR BTL (IV SOLUTION) IMPLANT
PACK GENERAL/GYN (CUSTOM PROCEDURE TRAY) ×3 IMPLANT
SUT MNCRL AB 4-0 PS2 18 (SUTURE) ×3 IMPLANT
SUT SILK 2 0 SH (SUTURE) IMPLANT
SUT VIC AB 2-0 SH 27 (SUTURE) ×2
SUT VIC AB 2-0 SH 27XBRD (SUTURE) ×1 IMPLANT
SUT VIC AB 3-0 SH 27 (SUTURE)
SUT VIC AB 3-0 SH 27X BRD (SUTURE) IMPLANT
SYR CONTROL 10ML LL (SYRINGE) IMPLANT
TOWEL NATURAL 6PK STERILE (DISPOSABLE) IMPLANT

## 2018-11-26 NOTE — Anesthesia Preprocedure Evaluation (Signed)
Anesthesia Evaluation  Patient identified by MRN, date of birth, ID band Patient awake    Reviewed: Allergy & Precautions, H&P , NPO status , Patient's Chart, lab work & pertinent test results  History of Anesthesia Complications (+) Family history of anesthesia reaction  Airway Mallampati: I  TM Distance: >3 FB Neck ROM: Full    Dental  (+) Teeth Intact, Dental Advisory Given   Pulmonary asthma ,    breath sounds clear to auscultation       Cardiovascular negative cardio ROS   Rhythm:Regular Rate:Normal     Neuro/Psych Anxiety Depression negative neurological ROS  negative psych ROS   GI/Hepatic negative GI ROS, Neg liver ROS,   Endo/Other  negative endocrine ROS  Renal/GU negative Renal ROS  negative genitourinary   Musculoskeletal negative musculoskeletal ROS (+)   Abdominal   Peds negative pediatric ROS (+)  Hematology negative hematology ROS (+)   Anesthesia Other Findings   Reproductive/Obstetrics negative OB ROS                             Anesthesia Physical  Anesthesia Plan  ASA: II  Anesthesia Plan: General   Post-op Pain Management:    Induction: Intravenous  PONV Risk Score and Plan:   Airway Management Planned: LMA  Additional Equipment:   Intra-op Plan:   Post-operative Plan: Extubation in OR  Informed Consent: I have reviewed the patients History and Physical, chart, labs and discussed the procedure including the risks, benefits and alternatives for the proposed anesthesia with the patient or authorized representative who has indicated his/her understanding and acceptance.     Dental advisory given  Plan Discussed with: CRNA, Anesthesiologist and Surgeon  Anesthesia Plan Comments:         Anesthesia Quick Evaluation

## 2018-11-26 NOTE — H&P (Signed)
Jade Kelly  Location: [facility] Patient #: [id number] DOB: 20-Jul-1986 Single / Language: Cleophus Molt / Race: White Female  History of Present Illness  Patient words: Patient sent at the request of Dr. Ammie Ferrier for right breast mass and pain. The patient has a history of a right breast lumpectomy for fibroadenoma in 2015. She is developed pain behind her right nipple. Workup was done which includes mammography and ultrasound which showed a clumped area of retroareolar masses core biopsy-proven to be fibroadenoma. A second area at 10:00 was biopsied and this was fibrocystic change. The pain is beginning to impact her quality of life. Location is behind the right nipple. Left breast is normal. She has no discharge. Patient denies any fever or chills.               Diagnosis 1. Breast, right, needle core biopsy, retroareolar - FIBROADENOMA. 2. Breast, right, needle core biopsy, 10 o'clock - FIBROCYSTIC CHANGES. Microscopic Comment 1. The results were reported to [facility] on 10/12/2018. Intradepartmental consultation was obtained (Dr. Lyndon Code - 2). Gillie Manners MD Pathologist, Electronic Signature (Case signed 10/12/2018) Specimen Gross and Clinical Information Specimen Comment 1. TIF: 421 PM; extracted < 5 min; enlarging palpable retroareolar lump with pain 2. TIF: 431 PM; extracted < 5 min; incidental mass in upper outer right breast Specimen(s) Obtained: 1. Breast, right, needle core biopsy, retroareolar 2. Breast, right, needle core biopsy, 10 o'clock Specimen Clinical.  The patient is a 33 year old female.   Past Surgical History  Breast Biopsy Right. Oral Surgery  Diagnostic Studies History  Colonoscopy never Mammogram within last year Pap Smear 1-5 years ago  Allergies  Penicillin G Pot in Dextrose *PENICILLINS*  Medication History  Doxycycline Hyclate (100MG  Tablet, Oral  prn) Active. ALPRAZolam (0.25MG  Tablet, Oral prn) Active. buPROPion HCl ER (SR) (150MG  Tablet ER 12HR, Oral) Active. FLUoxetine HCl (60MG  Tablet, Oral) Active. ProAir HFA (108 (90 Base)MCG/ACT Aerosol Soln, Inhalation) Active. Medications Reconciled  Social History  Caffeine use Coffee, Tea. No alcohol use No drug use Tobacco use Never smoker.  Family History  Alcohol Abuse Family Members In General. Anesthetic complications Family Members In General. Cerebrovascular Accident Family Members In General. Colon Polyps Family Members In General. Diabetes Mellitus Family Members In General, Mother. Heart Disease Mother. Heart disease in female family member before age 5 Hypertension Family Members In General, Mother. Kidney Disease Mother.  Pregnancy / Birth History Age at menarche 65 years. Contraceptive History Oral contraceptives. Gravida 0 Para 0 Regular periods  Other Problems  Anxiety Disorder Asthma Depression Lump In Breast     Review of Systems  General Not Present- Appetite Loss, Chills, Fatigue, Fever, Night Sweats, Weight Gain and Weight Loss. Skin Not Present- Change in Wart/Mole, Dryness, Hives, Jaundice, New Lesions, Non-Healing Wounds, Rash and Ulcer. HEENT Not Present- Earache, Hearing Loss, Hoarseness, Nose Bleed, Oral Ulcers, Ringing in the Ears, Seasonal Allergies, Sinus Pain, Sore Throat, Visual Disturbances, Wears glasses/contact lenses and Yellow Eyes. Respiratory Not Present- Bloody sputum, Chronic Cough, Difficulty Breathing, Snoring and Wheezing. Breast Present- Breast Mass and Breast Pain. Not Present- Nipple Discharge and Skin Changes. Cardiovascular Not Present- Chest Pain, Difficulty Breathing Lying Down, Leg Cramps, Palpitations, Rapid Heart Rate, Shortness of Breath and Swelling of Extremities. Gastrointestinal Not Present- Abdominal Pain, Bloating, Bloody Stool, Change in Bowel Habits, Chronic diarrhea,  Constipation, Difficulty Swallowing, Excessive gas, Gets full quickly at meals, Hemorrhoids, Indigestion, Nausea, Rectal Pain and Vomiting. Female Genitourinary Not Present- Frequency, Nocturia,  Painful Urination, Pelvic Pain and Urgency. Musculoskeletal Not Present- Back Pain, Joint Pain, Joint Stiffness, Muscle Pain, Muscle Weakness and Swelling of Extremities. Neurological Not Present- Decreased Memory, Fainting, Headaches, Numbness, Seizures, Tingling, Tremor, Trouble walking and Weakness. Psychiatric Present- Anxiety and Depression. Not Present- Bipolar, Change in Sleep Pattern, Fearful and Frequent crying. Endocrine Not Present- Cold Intolerance, Excessive Hunger, Hair Changes, Heat Intolerance, Hot flashes and New Diabetes. Hematology Not Present- Blood Thinners, Easy Bruising, Excessive bleeding, Gland problems, HIV and Persistent Infections.  Vitals  10/26/2018 11:14 AM Weight: 189.38 lb Height: 67in Body Surface Area: 1.98 m Body Mass Index: 29.66 kg/m  Pulse: 97 (Regular)  BP: 114/70 (Sitting, Left Arm, Standard)      Physical Exam   General Mental Status-Alert. General Appearance-Consistent with stated age. Hydration-Well hydrated. Voice-Normal.  Head and Neck Head-normocephalic, atraumatic with no lesions or palpable masses. Trachea-midline. Thyroid Gland Characteristics - normal size and consistency.  Eye Eyeball - Bilateral-Extraocular movements intact. Sclera/Conjunctiva - Bilateral-No scleral icterus.  Chest and Lung Exam Chest and lung exam reveals -quiet, even and easy respiratory effort with no use of accessory muscles and on auscultation, normal breath sounds, no adventitious sounds and normal vocal resonance. Inspection Chest Wall - Normal. Back - normal.  Breast Breast - Left-Symmetric, Non Tender, No Biopsy scars, no Dimpling, No Inflammation, No Lumpectomy scars, No Mastectomy scars, No Peau d'  Orange. Breast - Right-Symmetric, Non Tender, No Biopsy scars, no Dimpling, No Inflammation, No Lumpectomy scars, No Mastectomy scars, No Peau d' Orange. Breast Lump-No Palpable Breast Mass.  Cardiovascular Cardiovascular examination reveals -normal heart sounds, regular rate and rhythm with no murmurs and normal pedal pulses bilaterally.  Neurologic Neurologic evaluation reveals -alert and oriented x 3 with no impairment of recent or remote memory. Mental Status-Normal.  Lymphatic Head & Neck  General Head & Neck Lymphatics: Bilateral - Description - Normal. Axillary  General Axillary Region: Bilateral - Description - Normal. Tenderness - Non Tender.    Assessment & Plan   BREAST FIBROADENOMA, RIGHT (D24.1) Impression: The patient desires excision due to pain. Discussed right breast seed localized lumpectomy. Risk of lumpectomy include bleeding, infection, seroma, more surgery, use of seed/wire, wound care, cosmetic deformity and the need for other treatments, death , blood clots, death. Pt agrees to proceed.  Current Plans Pt Education - [facility] Breast Pains Education Pt Education - [facility] Breast Biopsy HCI: discussed with patient and provided information.

## 2018-11-26 NOTE — Transfer of Care (Signed)
Immediate Anesthesia Transfer of Care Note  Patient: Jade Kelly  Procedure(s) Performed: RIGHT BREAST LUMPECTOMY WITH RADIOACTIVE SEED LOCALIZATION (Right Breast)  Patient Location: PACU  Anesthesia Type:General  Level of Consciousness: drowsy  Airway & Oxygen Therapy: Patient Spontanous Breathing and Patient connected to face mask oxygen  Post-op Assessment: Report given to RN and Post -op Vital signs reviewed and stable  Post vital signs: Reviewed and stable  Last Vitals:  Vitals Value Taken Time  BP 123/80 11/26/2018 11:21 AM  Temp    Pulse 105 11/26/2018 11:27 AM  Resp 21 11/26/2018 11:27 AM  SpO2 100 % 11/26/2018 11:27 AM  Vitals shown include unvalidated device data.  Last Pain:  Vitals:   11/26/18 0838  TempSrc:   PainSc: 0-No pain      Patients Stated Pain Goal: 3 (20/91/98 0221)  Complications: No apparent anesthesia complications

## 2018-11-26 NOTE — Interval H&P Note (Signed)
History and Physical Interval Note:  11/26/2018 9:47 AM  Jade Kelly  has presented today for surgery, with the diagnosis of RIGHT BREAST FIBROADENOMA  The various methods of treatment have been discussed with the patient and family. After consideration of risks, benefits and other options for treatment, the patient has consented to  Procedure(s): RIGHT BREAST LUMPECTOMY WITH RADIOACTIVE SEED LOCALIZATION (Right) as a surgical intervention .  The patient's history has been reviewed, patient examined, no change in status, stable for surgery.  I have reviewed the patient's chart and labs.  Questions were answered to the patient's satisfaction.     [place]

## 2018-11-26 NOTE — Anesthesia Procedure Notes (Signed)
Procedure Name: LMA Insertion Date/Time: 11/26/2018 10:30 AM Performed by: Valda Favia, CRNA Pre-anesthesia Checklist: Patient identified, Emergency Drugs available, Suction available and Patient being monitored Patient Re-evaluated:Patient Re-evaluated prior to induction Oxygen Delivery Method: Circle System Utilized Preoxygenation: Pre-oxygenation with 100% oxygen Induction Type: IV induction Ventilation: Mask ventilation without difficulty LMA: LMA inserted LMA Size: 4.0 Number of attempts: 1 Airway Equipment and Method: Bite block Placement Confirmation: positive ETCO2 and breath sounds checked- equal and bilateral Tube secured with: Tape Dental Injury: Teeth and Oropharynx as per pre-operative assessment

## 2018-11-26 NOTE — Discharge Instructions (Signed)
** No Tylenol or NSAIDS before 2:45pm today.  [facility] Office Phone Number [phone]  BREAST BIOPSY/ PARTIAL MASTECTOMY: POST OP INSTRUCTIONS  Always review your discharge instruction sheet given to you by the facility where your surgery was performed.  IF YOU HAVE DISABILITY OR FAMILY LEAVE FORMS, YOU MUST BRING THEM TO THE OFFICE FOR PROCESSING.  DO NOT GIVE THEM TO YOUR DOCTOR.  1. A prescription for pain medication may be given to you upon discharge.  Take your pain medication as prescribed, if needed.  If narcotic pain medicine is not needed, then you may take acetaminophen (Tylenol) or ibuprofen (Advil) as needed. 2. Take your usually prescribed medications unless otherwise directed 3. If you need a refill on your pain medication, please contact your pharmacy.  They will contact our office to request authorization.  Prescriptions will not be filled after 5pm or on week-ends. 4. You should eat very light the first 24 hours after surgery, such as soup, crackers, pudding, etc.  Resume your normal diet the day after surgery. 5. Most patients will experience some swelling and bruising in the breast.  Ice packs and a good support bra will help.  Swelling and bruising can take several days to resolve.  6. It is common to experience some constipation if taking pain medication after surgery.  Increasing fluid intake and taking a stool softener will usually help or prevent this problem from occurring.  A mild laxative (Milk of Magnesia or Miralax) should be taken according to package directions if there are no bowel movements after 48 hours. 7. Unless discharge instructions indicate otherwise, you may remove your bandages 24-48 hours after surgery, and you may shower at that time.  You may have steri-strips (small skin tapes) in place directly over the incision.  These strips should be left on the skin for 7-10 days.  If your surgeon used skin glue on the incision, you may shower  in 24 hours.  The glue will flake off over the next 2-3 weeks.  Any sutures or staples will be removed at the office during your follow-up visit. 8. ACTIVITIES:  You may resume regular daily activities (gradually increasing) beginning the next day.  Wearing a good support bra or sports bra minimizes pain and swelling.  You may have sexual intercourse when it is comfortable. a. You may drive when you no longer are taking prescription pain medication, you can comfortably wear a seatbelt, and you can safely maneuver your car and apply brakes. b. RETURN TO WORK:  ______________________________________________________________________________________ 9. You should see your doctor in the office for a follow-up appointment approximately two weeks after your surgery.  Your doctors nurse will typically make your follow-up appointment when she calls you with your pathology report.  Expect your pathology report 2-3 business days after your surgery.  You may call to check if you do not hear from [place] after three days. 10. OTHER INSTRUCTIONS: _______________________________________________________________________________________________ _____________________________________________________________________________________________________________________________________ _____________________________________________________________________________________________________________________________________ _____________________________________________________________________________________________________________________________________  WHEN TO CALL YOUR DOCTOR: 1. Fever over 101.0 2. Nausea and/or vomiting. 3. Extreme swelling or bruising. 4. Continued bleeding from incision. 5. Increased pain, redness, or drainage from the incision.  The clinic staff is available to answer your questions during regular business hours.  Please dont hesitate to call and ask to speak to one of the nurses for clinical concerns.  If you  have a medical emergency, go to the nearest emergency room or call 911.  A surgeon from [facility] is always on call at the  hospital.  For further questions, please visit [email or web address]   Post Anesthesia Home Care Instructions  Activity: Get plenty of rest for the remainder of the day. A responsible individual must stay with you for 24 hours following the procedure.  For the next 24 hours, DO NOT: -Drive a car -Paediatric nurse -Drink alcoholic beverages -Take any medication unless instructed by your physician -Make any legal decisions or sign important papers.  Meals: Start with liquid foods such as gelatin or soup. Progress to regular foods as tolerated. Avoid greasy, spicy, heavy foods. If nausea and/or vomiting occur, drink only clear liquids until the nausea and/or vomiting subsides. Call your physician if vomiting continues.  Special Instructions/Symptoms: Your throat may feel dry or sore from the anesthesia or the breathing tube placed in your throat during surgery. If this causes discomfort, gargle with warm salt water. The discomfort should disappear within 24 hours.  If you had a scopolamine patch placed behind your ear for the management of post- operative nausea and/or vomiting:  1. The medication in the patch is effective for 72 hours, after which it should be removed.  Wrap patch in a tissue and discard in the trash. Wash hands thoroughly with soap and water. 2. You may remove the patch earlier than 72 hours if you experience unpleasant side effects which may include dry mouth, dizziness or visual disturbances. 3. Avoid touching the patch. Wash your hands with soap and water after contact with the patch.

## 2018-11-26 NOTE — Anesthesia Postprocedure Evaluation (Signed)
Anesthesia Post Note  Patient: Jade Kelly  Procedure(s) Performed: RIGHT BREAST LUMPECTOMY WITH RADIOACTIVE SEED LOCALIZATION (Right Breast)     Patient location during evaluation: PACU Anesthesia Type: General Level of consciousness: awake and alert Pain management: pain level controlled Vital Signs Assessment: post-procedure vital signs reviewed and stable Respiratory status: spontaneous breathing, nonlabored ventilation, respiratory function stable and patient connected to nasal cannula oxygen Cardiovascular status: blood pressure returned to baseline and stable Postop Assessment: no apparent nausea or vomiting Anesthetic complications: no    Last Vitals:  Vitals:   11/26/18 1134 11/26/18 1149  BP: 129/80 114/78  Pulse: 100 98  Resp: 15 (!) 21  Temp:    SpO2: 100% 97%    Last Pain:  Vitals:   11/26/18 1149  TempSrc:   PainSc: 0-No pain                 Kadence Mimbs

## 2018-11-26 NOTE — Op Note (Signed)
Preoperative diagnosis: Right breast mass  Postoperative diagnosis: Same  Procedure: Seed localized right breast lumpectomy  Surgeon: Erroll Luna, MD  Anesthesia: LMA with local 0.25% Sensorcaine with epinephrine  EBL: 10 cc  Drains: None  IV fluids: Per anesthesia record  Indications for procedure: The patient is a 33 year old female with a right breast mass.  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  Risk, benefits and other options of treatment and/or observation discussed with the patient.The procedure has been discussed with the patient. Alternatives to surgery have been discussed with the patient.  Risks of surgery include bleeding,  Infection,  Seroma formation, death,  and the need for further surgery.   The patient understands and wishes to proceed.   Description of procedure: Patient was brought to the operative room after being seen in the holding area.  The seed was verified with neoprobe prior to going back to the operating.  Right side was marked as correct side and questions were answered.  She was placed supine upon the OR table.  After induction of general anesthesia, the right breast was prepped and draped in sterile fashion timeout was done.  She received appropriate preoperative antibiotics.  Neoprobe was used to identify the seed just below the right nipple.  Curvilinear incision was made along the lateral border of the nipple areolar complex after infiltration of the skin with local anesthetic.  All tissue around the seed and clip were excised and Faxitron revealed both seed and clip to be in specimen this was sent to pathology.  Hemostasis achieved.  Gross margins negative.  Wound closed with 3-0 Vicryl and 4-0 Monocryl.  Dermabond applied.  All final counts found to be correct.  The patient was awoke extubated taken to recovery in satisfactory condition.

## 2018-11-27 ENCOUNTER — Encounter (HOSPITAL_COMMUNITY): Payer: Self-pay | Admitting: Surgery

## 2018-12-01 ENCOUNTER — Encounter (HOSPITAL_COMMUNITY): Payer: Self-pay

## 2018-12-01 ENCOUNTER — Emergency Department (HOSPITAL_COMMUNITY)
Admission: EM | Admit: 2018-12-01 | Discharge: 2018-12-01 | Disposition: A | Discharge status: Home / Self Care | Payer: Medicaid Other | Attending: Emergency Medicine | Admitting: Emergency Medicine

## 2018-12-01 ENCOUNTER — Other Ambulatory Visit: Payer: Self-pay

## 2018-12-01 ENCOUNTER — Emergency Department (HOSPITAL_COMMUNITY): Payer: Medicaid Other

## 2018-12-01 DIAGNOSIS — Z7982 Long term (current) use of aspirin: Secondary | ICD-10-CM | POA: Insufficient documentation

## 2018-12-01 DIAGNOSIS — F329 Major depressive disorder, single episode, unspecified: Secondary | ICD-10-CM | POA: Diagnosis not present

## 2018-12-01 DIAGNOSIS — J45909 Unspecified asthma, uncomplicated: Secondary | ICD-10-CM | POA: Insufficient documentation

## 2018-12-01 DIAGNOSIS — R002 Palpitations: Secondary | ICD-10-CM | POA: Insufficient documentation

## 2018-12-01 DIAGNOSIS — Z79899 Other long term (current) drug therapy: Secondary | ICD-10-CM | POA: Diagnosis not present

## 2018-12-01 DIAGNOSIS — F419 Anxiety disorder, unspecified: Secondary | ICD-10-CM | POA: Diagnosis not present

## 2018-12-01 LAB — CBC WITH DIFFERENTIAL/PLATELET
Abs Immature Granulocytes: 0.04 10*3/uL (ref 0.00–0.07)
BASOS ABS: 0.1 10*3/uL (ref 0.0–0.1)
Basophils Relative: 0 %
Eosinophils Absolute: 0.1 10*3/uL (ref 0.0–0.5)
Eosinophils Relative: 1 %
HCT: 41.3 % (ref 36.0–46.0)
Hemoglobin: 13.7 g/dL (ref 12.0–15.0)
Immature Granulocytes: 0 %
Lymphocytes Relative: 18 %
Lymphs Abs: 2.1 10*3/uL (ref 0.7–4.0)
MCH: 28 pg (ref 26.0–34.0)
MCHC: 33.2 g/dL (ref 30.0–36.0)
MCV: 84.3 fL (ref 80.0–100.0)
Monocytes Absolute: 0.6 10*3/uL (ref 0.1–1.0)
Monocytes Relative: 6 %
NRBC: 0 % (ref 0.0–0.2)
Neutro Abs: 8.5 10*3/uL — ABNORMAL HIGH (ref 1.7–7.7)
Neutrophils Relative %: 75 %
Platelets: 299 10*3/uL (ref 150–400)
RBC: 4.9 MIL/uL (ref 3.87–5.11)
RDW: 12.2 % (ref 11.5–15.5)
WBC: 11.4 10*3/uL — ABNORMAL HIGH (ref 4.0–10.5)

## 2018-12-01 LAB — COMPREHENSIVE METABOLIC PANEL
ALT: 16 U/L (ref 0–44)
ANION GAP: 7 (ref 5–15)
AST: 15 U/L (ref 15–41)
Albumin: 3.9 g/dL (ref 3.5–5.0)
Alkaline Phosphatase: 71 U/L (ref 38–126)
BUN: 21 mg/dL — ABNORMAL HIGH (ref 6–20)
CO2: 23 mmol/L (ref 22–32)
Calcium: 9.2 mg/dL (ref 8.9–10.3)
Chloride: 105 mmol/L (ref 98–111)
Creatinine, Ser: 0.68 mg/dL (ref 0.44–1.00)
GFR calc non Af Amer: >60 mL/min (ref >60)
Glucose, Bld: 91 mg/dL (ref 70–99)
POTASSIUM: 3.8 mmol/L (ref 3.5–5.1)
SODIUM: 135 mmol/L (ref 135–145)
Total Bilirubin: 0.2 mg/dL — ABNORMAL LOW (ref 0.3–1.2)
Total Protein: 7.5 g/dL (ref 6.5–8.1)

## 2018-12-01 LAB — D-DIMER, QUANTITATIVE: D-Dimer, Quant: 0.31 ug/mL-FEU (ref 0.00–0.50)

## 2018-12-01 LAB — TROPONIN I: Troponin I: <0.03 ng/mL (ref <0.03)

## 2018-12-01 NOTE — Discharge Instructions (Addendum)
Follow-up with your family doctor next week.  Your family doctor can also arrange for you to get a CAT scan to look at your lung because although few small lesions seen

## 2018-12-01 NOTE — ED Provider Notes (Signed)
[facility] EMERGENCY DEPARTMENT Provider Note   CSN: [id number] Arrival date & time: 12/01/18  1814     History   Chief Complaint Chief Complaint  Patient presents with  . Palpitations    HPI Jade Kelly is a 33 y.o. female.  Patient complains of palpitations  The history is provided by the patient. No language interpreter was used.  Palpitations  Palpitations quality:  Irregular Onset quality:  Sudden Timing:  Sporadic Progression:  Resolved Chronicity:  New Context: anxiety   Relieved by:  Nothing Worsened by:  Nothing Ineffective treatments:  None tried Associated symptoms: no back pain, no chest pain and no cough   Risk factors: no diabetes mellitus     Past Medical History:  Diagnosis Date  . Anxiety   . Asthma    prn inhaler  . Breast mass, right 11/2013  . Depression   . Family history of anesthesia complication    maternal grandmother has hx. of post-op nausea  . GERD (gastroesophageal reflux disease)     Patient Active Problem List   Diagnosis Date Noted  . Post-operative state 12/17/2013  . Metatarsal fracture 06/30/2012    Past Surgical History:  Procedure Laterality Date  . BREAST LUMPECTOMY WITH RADIOACTIVE SEED LOCALIZATION Right 11/26/2018   Procedure: RIGHT BREAST LUMPECTOMY WITH RADIOACTIVE SEED LOCALIZATION;  Surgeon: Erroll Luna, MD;  Location: [facility];  Service: General;  Laterality: Right;  . EXCISION OF BREAST BIOPSY Right 12/02/2013   Procedure: RIGHT EXCISIONAL BREAST BIOPSY;  Surgeon: Marcello Moores A. Cornett, MD;  Location: [place];  Service: General;  Laterality: Right;  . WISDOM TOOTH EXTRACTION       OB History   No obstetric history on file.      Home Medications    Prior to Admission medications   Medication Sig Start Date End Date Taking? Authorizing Provider  albuterol (PROAIR HFA) 108 (90 BASE) MCG/ACT inhaler Inhale 2 puffs into the lungs every 6 (six) hours as needed for wheezing or shortness  of breath.    Yes [provider]  ALPRAZolam (XANAX) 0.25 MG tablet Take 0.25 mg by mouth daily as needed for anxiety.   Yes [provider]  aspirin 81 MG chewable tablet Chew 81 mg by mouth once.   Yes [provider]  buPROPion (WELLBUTRIN SR) 150 MG 12 hr tablet Take 150 mg by mouth daily.   Yes [provider]  clindamycin (CLEOCIN T) 1 % external solution Apply 1 application topically daily. Applied to face 11/17/18  Yes [provider]  FLUoxetine (PROZAC) 20 MG capsule Take 60 mg by mouth daily.   Yes [provider]  HYDROcodone-acetaminophen (NORCO/VICODIN) 5-325 MG tablet Take 1 tablet by mouth every 6 (six) hours as needed for moderate pain. 11/26/18  Yes Cornett, Marcello Moores, MD  ibuprofen (ADVIL,MOTRIN) 800 MG tablet Take 1 tablet (800 mg total) by mouth every 8 (eight) hours as needed. 11/26/18  Yes Erroll Luna, MD    Family History Family History  Problem Relation Age of Onset  . Anesthesia problems Maternal Grandmother        post-op nausea    Social History Social History   Tobacco Use  . Smoking status: Never Smoker  . Smokeless tobacco: Never Used  Substance Use Topics  . Alcohol use: No  . Drug use: No     Allergies   Penicillins   Review of Systems Review of Systems  Constitutional: Negative for appetite change and fatigue.  HENT:  Negative for congestion, ear discharge and sinus pressure.   Eyes: Negative for discharge.  Respiratory: Negative for cough.   Cardiovascular: Positive for palpitations. Negative for chest pain.  Gastrointestinal: Negative for abdominal pain and diarrhea.  Genitourinary: Negative for frequency and hematuria.  Musculoskeletal: Negative for back pain.  Skin: Negative for rash.  Neurological: Negative for seizures and headaches.  Psychiatric/Behavioral: Negative for hallucinations.     Physical Exam Updated Vital Signs BP 127/67   Pulse 83   Temp 98.7 F (37.1 C)  (Oral)   Resp 19   Ht 5\' 7"  (1.702 m)   Wt 83.5 kg   LMP 11/05/2018   SpO2 99%   BMI 28.82 kg/m   Physical Exam Vitals signs and nursing note reviewed.  Constitutional:      Appearance: She is well-developed.  HENT:     Head: Normocephalic.     Nose: Nose normal.  Eyes:     General: No scleral icterus.    Conjunctiva/sclera: Conjunctivae normal.  Neck:     Musculoskeletal: Neck supple.     Thyroid: No thyromegaly.  Cardiovascular:     Rate and Rhythm: Normal rate and regular rhythm.     Heart sounds: No murmur. No friction rub. No gallop.   Pulmonary:     Breath sounds: No stridor. No wheezing or rales.  Chest:     Chest wall: No tenderness.  Abdominal:     General: There is no distension.     Tenderness: There is no abdominal tenderness. There is no rebound.  Musculoskeletal: Normal range of motion.  Lymphadenopathy:     Cervical: No cervical adenopathy.  Skin:    Findings: No erythema or rash.  Neurological:     Mental Status: She is oriented to person, place, and time.     Motor: No abnormal muscle tone.     Coordination: Coordination normal.  Psychiatric:        Behavior: Behavior normal.      ED Treatments / Results  Labs (all labs ordered are listed, but only abnormal results are displayed) Labs Reviewed  CBC WITH DIFFERENTIAL/PLATELET - Abnormal; Notable for the following components:      Result Value   WBC 11.4 (*)    Neutro Abs 8.5 (*)    All other components within normal limits  COMPREHENSIVE METABOLIC PANEL - Abnormal; Notable for the following components:   BUN 21 (*)    Total Bilirubin 0.2 (*)    All other components within normal limits  TROPONIN I  D-DIMER, QUANTITATIVE (NOT AT [facility])    EKG None  Radiology Dg Chest 2 View  Result Date: 12/01/2018 CLINICAL DATA:  Palpitations since yesterday. Status post breast lumpectomy November 26, 2018. EXAM: CHEST - 2 VIEW COMPARISON:  Chest radiograph Mar 14, 2006 FINDINGS: Cardiomediastinal  silhouette is normal. No pleural effusions or focal consolidations. Subcentimeter RIGHT lung base granuloma versus nodule. Additional suspected granulomas posterior lungs. Trachea projects midline and there is no pneumothorax. Small volume subcutaneous gas RIGHT breast. No destructive bony lesions. IMPRESSION: 1. No acute cardiopulmonary process. 2. RIGHT breast subcutaneous gas consistent with recent surgery. 3. Small suspected granulomas though given history of breast cancer, recommend NONEMERGENT contrast enhanced CT chest to exclude pulmonary nodules. Electronically Signed   By: Elon Alas M.D.   On: 12/01/2018 19:57    Procedures Procedures (including critical care time)  Medications Ordered in ED Medications - No data to display   Initial Impression / Assessment and Plan / ED  Course  I have reviewed the triage vital signs and the nursing notes.  Pertinent labs & imaging results that were available during my care of the patient were reviewed by me and considered in my medical decision making (see chart for details).     Labs are unremarkable.  Chest x-ray shows very small granulomas.  But radiologist recommends CT scan.  Patient will be referred back to her family doctor for the palpitations  Final Clinical Impressions(s) / ED Diagnoses   Final diagnoses:  Palpitations    ED Discharge Orders    None       Milton Ferguson, MD 12/01/18 2045

## 2018-12-01 NOTE — ED Triage Notes (Signed)
Patient had breast lumpectomy last Thursday (11/26/18). She reports palpitations that began yesterday and are increasing in frequency today. Pt also reports feeling sob today.

## 2019-06-07 ENCOUNTER — Emergency Department (HOSPITAL_COMMUNITY)
Admission: EM | Admit: 2019-06-07 | Discharge: 2019-06-08 | Disposition: A | Discharge status: Home / Self Care | Payer: Medicaid Other | Attending: Emergency Medicine | Admitting: Emergency Medicine

## 2019-06-07 ENCOUNTER — Emergency Department (HOSPITAL_COMMUNITY): Payer: Medicaid Other

## 2019-06-07 ENCOUNTER — Encounter (HOSPITAL_COMMUNITY): Payer: Self-pay | Admitting: *Deleted

## 2019-06-07 ENCOUNTER — Other Ambulatory Visit: Payer: Self-pay

## 2019-06-07 DIAGNOSIS — Z79899 Other long term (current) drug therapy: Secondary | ICD-10-CM | POA: Diagnosis not present

## 2019-06-07 DIAGNOSIS — J45909 Unspecified asthma, uncomplicated: Secondary | ICD-10-CM | POA: Diagnosis not present

## 2019-06-07 DIAGNOSIS — M79602 Pain in left arm: Secondary | ICD-10-CM | POA: Diagnosis present

## 2019-06-07 DIAGNOSIS — M62838 Other muscle spasm: Secondary | ICD-10-CM | POA: Insufficient documentation

## 2019-06-07 LAB — BASIC METABOLIC PANEL
Anion gap: 10 (ref 5–15)
BUN: 17 mg/dL (ref 6–20)
CO2: 23 mmol/L (ref 22–32)
Calcium: 9.2 mg/dL (ref 8.9–10.3)
Chloride: 107 mmol/L (ref 98–111)
Creatinine, Ser: 0.75 mg/dL (ref 0.44–1.00)
GFR calc Af Amer: >60 mL/min (ref >60)
GFR calc non Af Amer: >60 mL/min (ref >60)
Glucose, Bld: 84 mg/dL (ref 70–99)
Potassium: 4 mmol/L (ref 3.5–5.1)
Sodium: 140 mmol/L (ref 135–145)

## 2019-06-07 LAB — CBC
HCT: 41.3 % (ref 36.0–46.0)
Hemoglobin: 13.4 g/dL (ref 12.0–15.0)
MCH: 28 pg (ref 26.0–34.0)
MCHC: 32.4 g/dL (ref 30.0–36.0)
MCV: 86.4 fL (ref 80.0–100.0)
Platelets: 318 10*3/uL (ref 150–400)
RBC: 4.78 MIL/uL (ref 3.87–5.11)
RDW: 12.1 % (ref 11.5–15.5)
WBC: 10.6 10*3/uL — ABNORMAL HIGH (ref 4.0–10.5)
nRBC: 0 % (ref 0.0–0.2)

## 2019-06-07 LAB — I-STAT BETA HCG BLOOD, ED (MC, WL, AP ONLY): I-stat hCG, quantitative: <5 m[IU]/mL (ref <5)

## 2019-06-07 LAB — TROPONIN I (HIGH SENSITIVITY)
Troponin I (High Sensitivity): <2 ng/L (ref <18)
Troponin I (High Sensitivity): <2 ng/L (ref <18)

## 2019-06-07 MED ORDER — SODIUM CHLORIDE 0.9% FLUSH: 3.0000 mL | Freq: Once | INTRAVENOUS | Status: DC

## 2019-06-07 NOTE — ED Triage Notes (Addendum)
Pt reports left arm weakness, chest pains, palpitations, sob for a week. Reports chest pain radiates into her left back. No acute distress noted at triage. Hx of tick bite, has been treated with doxy x 2.

## 2019-06-08 NOTE — ED Provider Notes (Signed)
[facility] EMERGENCY DEPARTMENT Provider Note   CSN: [id number] Arrival date & time: 06/07/19  1856    History   Chief Complaint Chief Complaint  Patient presents with  . Arm Pain  . Shortness of Breath    HPI Jade Kelly is a 33 y.o. female.     Patient is a 33 year old female with a history of asthma, anxiety and depression who presents to the emergency department for multiple vague symptoms.  Reports that she has been experiencing a discomfort to her posterior left shoulder x 1 week.  Symptoms initially feel like a "heaviness" in the left arm, "like you got done working out".  Patient has subsequently noticed some pain in her posterior arm and shoulder which occasionally radiates to his left side and under the left breast.  Her pain will last a few minutes before spontaneously resolving.  These symptoms have become recurrent and more frequent over the past few days.  She spoke with her PCP over the phone who advised Pepcid and Naproxen, but this hasn't seemed to lessen symptom frequency.  Denies positional component.  Pain not aggravated by exertion.  No fevers, syncope or near syncope, vomiting, extremity numbness, leg swelling, hemoptysis. The patient has been restricting her Prozac and Wellbutrin at times in attempt to self-manage her anxiety/depression.  Patient reporting remote history of treatment for presumed Lyme disease after she pulled a tick off of her in June.  Endorses a small red mark at the site of engorgement.  She completed 2 weeks of doxycycline x2 for presumed Lyme's disease, but has never been tested for Lyme's disease.  She has not had doxycycline treatment for approximately 10 days.  The history is provided by the patient and a relative. No language interpreter was used.  Arm Pain Associated symptoms include shortness of breath.  Shortness of Breath   Past Medical History:  Diagnosis Date  . Anxiety   . Asthma    prn inhaler  . Breast  mass, right 11/2013  . Depression   . Family history of anesthesia complication    maternal grandmother has hx. of post-op nausea  . GERD (gastroesophageal reflux disease)     Patient Active Problem List   Diagnosis Date Noted  . Post-operative state 12/17/2013  . Metatarsal fracture 06/30/2012    Past Surgical History:  Procedure Laterality Date  . BREAST LUMPECTOMY WITH RADIOACTIVE SEED LOCALIZATION Right 11/26/2018   Procedure: RIGHT BREAST LUMPECTOMY WITH RADIOACTIVE SEED LOCALIZATION;  Surgeon: Erroll Luna, MD;  Location: [place];  Service: General;  Laterality: Right;  . EXCISION OF BREAST BIOPSY Right 12/02/2013   Procedure: RIGHT EXCISIONAL BREAST BIOPSY;  Surgeon: Marcello Moores A. Cornett, MD;  Location: [place];  Service: General;  Laterality: Right;  . WISDOM TOOTH EXTRACTION       OB History   No obstetric history on file.      Home Medications    Prior to Admission medications   Medication Sig Start Date End Date Taking? Authorizing Provider  albuterol (PROAIR HFA) 108 (90 BASE) MCG/ACT inhaler Inhale 2 puffs into the lungs every 6 (six) hours as needed for wheezing or shortness of breath.     [provider]  ALPRAZolam Duanne Moron) 0.25 MG tablet Take 0.25 mg by mouth daily as needed for anxiety.    [provider]  aspirin 81 MG chewable tablet Chew 81 mg by mouth once.    [provider]  buPROPion (WELLBUTRIN SR) 150 MG 12  hr tablet Take 150 mg by mouth daily.    [provider]  clindamycin (CLEOCIN T) 1 % external solution Apply 1 application topically daily. Applied to face 11/17/18   [provider]  FLUoxetine (PROZAC) 20 MG capsule Take 60 mg by mouth daily.    [provider]  HYDROcodone-acetaminophen (NORCO/VICODIN) 5-325 MG tablet Take 1 tablet by mouth every 6 (six) hours as needed for moderate pain. 11/26/18   Cornett, Marcello Moores, MD  ibuprofen (ADVIL,MOTRIN) 800 MG tablet Take 1 tablet (800 mg  total) by mouth every 8 (eight) hours as needed. 11/26/18   Erroll Luna, MD    Family History Family History  Problem Relation Age of Onset  . Anesthesia problems Maternal Grandmother        post-op nausea    Social History Social History   Tobacco Use  . Smoking status: Never Smoker  . Smokeless tobacco: Never Used  Substance Use Topics  . Alcohol use: No  . Drug use: No     Allergies   Penicillins   Review of Systems Review of Systems  Respiratory: Positive for shortness of breath.   Ten systems reviewed and are negative for acute change, except as noted in the HPI.    Physical Exam Updated Vital Signs BP 121/77   Pulse 82   Temp 97.8 F (36.6 C) (Oral)   Resp 15   LMP 05/10/2019 (Exact Date)   SpO2 99%   Physical Exam Vitals signs and nursing note reviewed.  Constitutional:      General: She is not in acute distress.    Appearance: She is well-developed. She is not diaphoretic.     Comments: Nontoxic appearing and in NAD  HENT:     Head: Normocephalic and atraumatic.  Eyes:     General: No scleral icterus.    Conjunctiva/sclera: Conjunctivae normal.  Neck:     Musculoskeletal: Normal range of motion.  Cardiovascular:     Rate and Rhythm: Normal rate and regular rhythm.     Pulses: Normal pulses.  Pulmonary:     Effort: Pulmonary effort is normal. No respiratory distress.     Breath sounds: No stridor. No wheezing.     Comments: Respirations even and unlabored Musculoskeletal: Normal range of motion.     Thoracic back: She exhibits tenderness and spasm.       Back:  Skin:    General: Skin is warm and dry.     Coloration: Skin is not pale.     Findings: No erythema or rash.  Neurological:     Mental Status: She is alert and oriented to person, place, and time.     Coordination: Coordination normal.  Psychiatric:        Behavior: Behavior normal.      ED Treatments / Results  Labs (all labs ordered are listed, but only abnormal  results are displayed) Labs Reviewed  CBC - Abnormal; Notable for the following components:      Result Value   WBC 10.6 (*)    All other components within normal limits  BASIC METABOLIC PANEL  I-STAT BETA HCG BLOOD, ED ([facility], [facility], [facility] ONLY)  TROPONIN I (HIGH SENSITIVITY)  TROPONIN I (HIGH SENSITIVITY)    EKG EKG Interpretation  Date/Time:  Monday June 07 2019 19:23:01 EDT Ventricular Rate:  90 PR Interval:  140 QRS Duration: 84 QT Interval:  360 QTC Calculation: 440 R Axis:   81 Text Interpretation:  Sinus rhythm one pvc Confirmed by Palumbo,  April [phone]) on 06/08/2019 12:18:11 AM   Radiology Dg Chest 2 View  Result Date: 06/07/2019 CLINICAL DATA:  Shortness of breath EXAM: CHEST - 2 VIEW COMPARISON:  12/01/2018 FINDINGS: The heart size and mediastinal contours are within normal limits. Both lungs are clear. The visualized skeletal structures are unremarkable. IMPRESSION: No active cardiopulmonary disease. Electronically Signed   By: Ulyses Jarred M.D.   On: 06/07/2019 19:49    Procedures Procedures (including critical care time)  Medications Ordered in ED Medications - No data to display   Initial Impression / Assessment and Plan / ED Course  I have reviewed the triage vital signs and the nursing notes.  Pertinent labs & imaging results that were available during my care of the patient were reviewed by me and considered in my medical decision making (see chart for details).        33 year old female presents for multiple vague complaints.  Ultimately, I feel that her symptoms are secondary to muscle spasms as she experienced a few of these episodes of discomfort while I was examining her and she exhibited some twitching and spasming in her shoulder.  I did question whether the patient had any personal or family history of seizures.  She denies this and states that she has had a normal EEG in the past.  She has had no alteration in consciousness with her symptoms over the  past week.  She is neurovascularly intact on my exam.  Cardiac work-up initiated in triage.  This is reassuring today.  Her chest x-ray is also without acute cardiopulmonary abnormality.  While I have recommended the patient to be started on muscle relaxers, she would prefer to follow-up with her primary care doctor.  Reports having an appointment scheduled in approximately 14 hours.  Patient with remote history of tick bite, but has been treated for Lyme's disease on 2 occasions over the past 1.5 months.  I do not believe her symptoms today are secondary to untreated Lyme's.  Have encouraged completion of Lyme's titers to confirm any residual, active infection.  She states that she can have this completed by her doctor as well.  Of note, the patient has been restricting her Prozac and Wellbutrin in an attempt to self manage her anxiety and depression.  Discussed with the patient that there may be a component of her anxiety that is perpetuating these episodes.  Have counseled her on taking her medication as prescribed.  I do not feel further emergent work-up is indicated at this time.  Return precautions discussed and provided. Patient discharged in stable condition with no unaddressed concerns.   Final Clinical Impressions(s) / ED Diagnoses   Final diagnoses:  Muscle spasm of left shoulder area    ED Discharge Orders    None       Antonietta Breach, PA-C 06/08/19 [place], April, MD 06/08/19 [phone]

## 2019-06-08 NOTE — ED Notes (Signed)
PA explained tests results and discharge plan to pt. and family .  

## 2019-06-08 NOTE — Discharge Instructions (Addendum)
Your work-up in the emergency department today was reassuring.  It is possible that your symptoms are due to muscle spasms.  This may benefit from use of a muscle relaxer which can be prescribed by your primary care doctor if indicated.  Until this time, alternate ice and heat to areas of discomfort.  Continue with Aleve.  Continue your daily prescribed medications.  We recommend that your primary care doctor draw Lyme titers for definitive results.  You may return for new or concerning symptoms.

## 2020-02-14 ENCOUNTER — Encounter (HOSPITAL_COMMUNITY): Payer: Self-pay | Admitting: Emergency Medicine

## 2020-02-14 ENCOUNTER — Other Ambulatory Visit: Payer: Self-pay

## 2020-02-14 ENCOUNTER — Emergency Department (HOSPITAL_COMMUNITY): Payer: Medicaid Other

## 2020-02-14 ENCOUNTER — Emergency Department (HOSPITAL_COMMUNITY)
Admission: EM | Admit: 2020-02-14 | Discharge: 2020-02-14 | Disposition: A | Discharge status: Home / Self Care | Payer: Medicaid Other | Attending: Emergency Medicine | Admitting: Emergency Medicine

## 2020-02-14 DIAGNOSIS — R0789 Other chest pain: Secondary | ICD-10-CM | POA: Insufficient documentation

## 2020-02-14 DIAGNOSIS — Z79899 Other long term (current) drug therapy: Secondary | ICD-10-CM | POA: Insufficient documentation

## 2020-02-14 DIAGNOSIS — R1011 Right upper quadrant pain: Secondary | ICD-10-CM | POA: Diagnosis not present

## 2020-02-14 DIAGNOSIS — Z7982 Long term (current) use of aspirin: Secondary | ICD-10-CM | POA: Diagnosis not present

## 2020-02-14 DIAGNOSIS — J45909 Unspecified asthma, uncomplicated: Secondary | ICD-10-CM | POA: Diagnosis not present

## 2020-02-14 DIAGNOSIS — R112 Nausea with vomiting, unspecified: Secondary | ICD-10-CM | POA: Diagnosis not present

## 2020-02-14 DIAGNOSIS — R1013 Epigastric pain: Secondary | ICD-10-CM | POA: Diagnosis present

## 2020-02-14 LAB — URINALYSIS, ROUTINE W REFLEX MICROSCOPIC
Bacteria, UA: NONE SEEN
Bilirubin Urine: NEGATIVE
Glucose, UA: NEGATIVE mg/dL
Hgb urine dipstick: NEGATIVE
Ketones, ur: NEGATIVE mg/dL
Leukocytes,Ua: NEGATIVE
Nitrite: NEGATIVE
Protein, ur: 30 mg/dL — AB
Specific Gravity, Urine: 1.023 (ref 1.005–1.030)
pH: 7 (ref 5.0–8.0)

## 2020-02-14 LAB — COMPREHENSIVE METABOLIC PANEL
ALT: 22 U/L (ref 0–44)
AST: 16 U/L (ref 15–41)
Albumin: 4.1 g/dL (ref 3.5–5.0)
Alkaline Phosphatase: 64 U/L (ref 38–126)
Anion gap: 9 (ref 5–15)
BUN: 15 mg/dL (ref 6–20)
CO2: 25 mmol/L (ref 22–32)
Calcium: 8.8 mg/dL — ABNORMAL LOW (ref 8.9–10.3)
Chloride: 105 mmol/L (ref 98–111)
Creatinine, Ser: 0.7 mg/dL (ref 0.44–1.00)
GFR calc Af Amer: >60 mL/min (ref >60)
GFR calc non Af Amer: >60 mL/min (ref >60)
Glucose, Bld: 87 mg/dL (ref 70–99)
Potassium: 4 mmol/L (ref 3.5–5.1)
Sodium: 139 mmol/L (ref 135–145)
Total Bilirubin: 0.4 mg/dL (ref 0.3–1.2)
Total Protein: 7.2 g/dL (ref 6.5–8.1)

## 2020-02-14 LAB — CBC WITH DIFFERENTIAL/PLATELET
Abs Immature Granulocytes: 0.01 10*3/uL (ref 0.00–0.07)
Basophils Absolute: 0.1 10*3/uL (ref 0.0–0.1)
Basophils Relative: 1 %
Eosinophils Absolute: 0.3 10*3/uL (ref 0.0–0.5)
Eosinophils Relative: 4 %
HCT: 40.7 % (ref 36.0–46.0)
Hemoglobin: 13.3 g/dL (ref 12.0–15.0)
Immature Granulocytes: 0 %
Lymphocytes Relative: 24 %
Lymphs Abs: 2 10*3/uL (ref 0.7–4.0)
MCH: 28.3 pg (ref 26.0–34.0)
MCHC: 32.7 g/dL (ref 30.0–36.0)
MCV: 86.6 fL (ref 80.0–100.0)
Monocytes Absolute: 0.6 10*3/uL (ref 0.1–1.0)
Monocytes Relative: 7 %
Neutro Abs: 5.4 10*3/uL (ref 1.7–7.7)
Neutrophils Relative %: 64 %
Platelets: 289 10*3/uL (ref 150–400)
RBC: 4.7 MIL/uL (ref 3.87–5.11)
RDW: 12.3 % (ref 11.5–15.5)
WBC: 8.3 10*3/uL (ref 4.0–10.5)
nRBC: 0 % (ref 0.0–0.2)

## 2020-02-14 LAB — TROPONIN I (HIGH SENSITIVITY): Troponin I (High Sensitivity): <2 ng/L (ref <18)

## 2020-02-14 LAB — PREGNANCY, URINE: Preg Test, Ur: NEGATIVE

## 2020-02-14 LAB — LIPASE, BLOOD: Lipase: 26 U/L (ref 11–51)

## 2020-02-14 MED ORDER — ALUM & MAG HYDROXIDE-SIMETH 200-200-20 MG/5ML PO SUSP
30.0000 mL | Freq: Once | ORAL | Status: AC
  Administered 2020-02-14: 30 mL via ORAL
  Filled 2020-02-14: qty 30

## 2020-02-14 MED ORDER — ONDANSETRON 4 MG PO TBDP: 4.0000 mg | ORAL_TABLET | Freq: Three times a day (TID) | ORAL | 0 refills | Status: AC | PRN

## 2020-02-14 MED ORDER — ONDANSETRON 4 MG PO TBDP
4.0000 mg | ORAL_TABLET | Freq: Once | ORAL | Status: AC
  Administered 2020-02-14: 08:00:00 4 mg via ORAL
  Filled 2020-02-14: qty 1

## 2020-02-14 MED ORDER — OMEPRAZOLE 20 MG PO CPDR: 20.0000 mg | DELAYED_RELEASE_CAPSULE | Freq: Every day | ORAL | 0 refills | Status: DC

## 2020-02-14 MED ORDER — LIDOCAINE VISCOUS HCL 2 % MT SOLN
15.0000 mL | Freq: Once | OROMUCOSAL | Status: AC
  Administered 2020-02-14: 08:00:00 15 mL via ORAL
  Filled 2020-02-14: qty 15

## 2020-02-14 NOTE — ED Provider Notes (Signed)
[facility] EMERGENCY DEPARTMENT Provider Note   CSN: [id number] Arrival date & time: 02/14/20  0551     History Chief Complaint  Patient presents with  . Abdominal Pain    Jade Kelly is a 34 y.o. female.  Patient with history of asthma presenting with intermittent epigastric pain for the past 24 hours.  She describes tight pain and pressure in her epigastrium that intermittently radiates to her chest.  It comes and goes lasting for several seconds at a time and comes back relatively quickly after this.  She tried Maalox without relief.  She has had acid reflux in the past but this feels different.  She has had nausea and dry heaving but no vomiting.  No fever.  No diarrhea or constipation.  She feels the pain radiate up into her chest at times and was concerned that she could be having a heart attack.  She denies any previous abdominal surgeries.  Does not take any acid reflux medications on a regular basis but occasionally takes Tums and Maalox.  She has not noticed any association with food.  The pain appears to be worse at night.  No change in position change. The pain is intermittent lasting for about 30 seconds and returns after about 30 seconds  The history is provided by the patient.  Abdominal Pain Associated symptoms: nausea   Associated symptoms: no chest pain, no dysuria, no fatigue, no fever, no shortness of breath and no vomiting        Past Medical History:  Diagnosis Date  . Anxiety   . Asthma    prn inhaler  . Breast mass, right 11/2013  . Depression   . Family history of anesthesia complication    maternal grandmother has hx. of post-op nausea  . GERD (gastroesophageal reflux disease)     Patient Active Problem List   Diagnosis Date Noted  . Post-operative state 12/17/2013  . Metatarsal fracture 06/30/2012    Past Surgical History:  Procedure Laterality Date  . BREAST LUMPECTOMY WITH RADIOACTIVE SEED LOCALIZATION Right 11/26/2018   Procedure: RIGHT  BREAST LUMPECTOMY WITH RADIOACTIVE SEED LOCALIZATION;  Surgeon: Erroll Luna, MD;  Location: [place];  Service: General;  Laterality: Right;  . EXCISION OF BREAST BIOPSY Right 12/02/2013   Procedure: RIGHT EXCISIONAL BREAST BIOPSY;  Surgeon: Marcello Moores A. Cornett, MD;  Location: [place];  Service: General;  Laterality: Right;  . WISDOM TOOTH EXTRACTION       OB History   No obstetric history on file.     Family History  Problem Relation Age of Onset  . Anesthesia problems Maternal Grandmother        post-op nausea    Social History   Tobacco Use  . Smoking status: Never Smoker  . Smokeless tobacco: Never Used  Substance Use Topics  . Alcohol use: No  . Drug use: No    Home Medications Prior to Admission medications   Medication Sig Start Date End Date Taking? Authorizing Provider  albuterol (PROAIR HFA) 108 (90 BASE) MCG/ACT inhaler Inhale 2 puffs into the lungs every 6 (six) hours as needed for wheezing or shortness of breath.     [provider]  ALPRAZolam Duanne Moron) 0.25 MG tablet Take 0.25 mg by mouth daily as needed for anxiety.    [provider]  aspirin 81 MG chewable tablet Chew 81 mg by mouth once.    [provider]  buPROPion (WELLBUTRIN SR) 150 MG 12 hr tablet Take 150  mg by mouth daily.    [provider]  clindamycin (CLEOCIN T) 1 % external solution Apply 1 application topically daily. Applied to face 11/17/18   [provider]  FLUoxetine (PROZAC) 20 MG capsule Take 60 mg by mouth daily.    [provider]  HYDROcodone-acetaminophen (NORCO/VICODIN) 5-325 MG tablet Take 1 tablet by mouth every 6 (six) hours as needed for moderate pain. 11/26/18   Cornett, Marcello Moores, MD  ibuprofen (ADVIL,MOTRIN) 800 MG tablet Take 1 tablet (800 mg total) by mouth every 8 (eight) hours as needed. 11/26/18   Cornett, Marcello Moores, MD    Allergies    Penicillins  Review of Systems   Review of Systems  Constitutional:  Negative for activity change, appetite change, fatigue and fever.  HENT: Negative for congestion and rhinorrhea.   Respiratory: Positive for chest tightness. Negative for shortness of breath.   Cardiovascular: Negative for chest pain.  Gastrointestinal: Positive for abdominal pain and nausea. Negative for vomiting.  Genitourinary: Negative for dysuria.  Musculoskeletal: Negative for arthralgias and myalgias.  Skin: Negative for rash.  Neurological: Negative for dizziness, weakness and headaches.   all other systems are negative except as noted in the HPI and PMH.    Physical Exam Updated Vital Signs BP 110/72   Pulse 82   Temp 98.6 F (37 C) (Oral)   Resp 17   Ht 5\' 7"  (1.702 m)   Wt 93 kg   LMP 02/01/2020   SpO2 99%   BMI 32.11 kg/m   Physical Exam Vitals and nursing note reviewed.  Constitutional:      General: She is not in acute distress.    Appearance: She is well-developed. She is obese. She is not ill-appearing.  HENT:     Head: Normocephalic and atraumatic.     Mouth/Throat:     Pharynx: No oropharyngeal exudate.  Eyes:     Conjunctiva/sclera: Conjunctivae normal.     Pupils: Pupils are equal, round, and reactive to light.  Neck:     Comments: No meningismus. Cardiovascular:     Rate and Rhythm: Normal rate and regular rhythm.     Heart sounds: Normal heart sounds. No murmur.  Pulmonary:     Effort: Pulmonary effort is normal. No respiratory distress.     Breath sounds: Normal breath sounds.  Chest:     Chest wall: No tenderness.  Abdominal:     Palpations: Abdomen is soft.     Tenderness: There is abdominal tenderness. There is no guarding or rebound.     Comments: Epigastric and right upper quadrant tenderness, no guarding or rebound  Musculoskeletal:        General: No tenderness. Normal range of motion.     Cervical back: Normal range of motion and neck supple.  Skin:    General: Skin is warm.     Capillary Refill: Capillary refill takes less than  2 seconds.  Neurological:     General: No focal deficit present.     Mental Status: She is alert and oriented to person, place, and time. Mental status is at baseline.     Cranial Nerves: No cranial nerve deficit.     Motor: No abnormal muscle tone.     Coordination: Coordination normal.     Comments:  5/5 strength throughout. CN 2-12 intact.Equal grip strength.   Psychiatric:        Behavior: Behavior normal.     ED Results / Procedures / Treatments   Labs (all labs ordered  are listed, but only abnormal results are displayed) Labs Reviewed  CBC WITH DIFFERENTIAL/PLATELET  COMPREHENSIVE METABOLIC PANEL  LIPASE, BLOOD  TROPONIN I (HIGH SENSITIVITY)    EKG EKG Interpretation  Date/Time:  Monday February 14 2020 06:14:59 EDT Ventricular Rate:  73 PR Interval:    QRS Duration: 85 QT Interval:  397 QTC Calculation: 438 R Axis:   83 Text Interpretation: Sinus rhythm Nonspecific T wave abnormality Confirmed by Ezequiel Essex [phone]) on 02/14/2020 6:23:48 AM   Radiology No results found.  Procedures Procedures (including critical care time)  Medications Ordered in ED Medications  ondansetron (ZOFRAN-ODT) disintegrating tablet 4 mg (has no administration in time range)  alum & mag hydroxide-simeth (MAALOX/MYLANTA) 200-200-20 MG/5ML suspension 30 mL (has no administration in time range)    And  lidocaine (XYLOCAINE) 2 % viscous mouth solution 15 mL (has no administration in time range)    ED Course  I have reviewed the triage vital signs and the nursing notes.  Pertinent labs & imaging results that were available during my care of the patient were reviewed by me and considered in my medical decision making (see chart for details).    MDM Rules/Calculators/A&P                     Intermittent epigastric pain since yesterday with nausea and dry heaving.  Vital stable. EKG with nonspecific T wave inversions septally.   Low suspicion for ACS Will check labs, give Zofran,  GI cocktail  Obtain basic labs including LFTs and lipase.  Patient given GI cocktail.  Will check gallbladder ultrasound.  Low suspicion for ACS, pulmonary embolism, aortic dissection.  Care to be transferred at shift change with labs and ultrasound pending. . Final Clinical Impression(s) / ED Diagnoses Final diagnoses:  RUQ pain    Rx / DC Orders ED Discharge Orders    None       Xaviera Flaten, Annie Main, MD 02/14/20 [phone]

## 2020-02-14 NOTE — ED Provider Notes (Signed)
  Physical Exam  BP 110/72   Pulse 82   Temp 98.6 F (37 C) (Oral)   Resp 17   Ht 5\' 7"  (1.702 m)   Wt 93 kg   LMP 02/01/2020   SpO2 99%   BMI 32.11 kg/m   Physical Exam  ED Course/Procedures     Procedures  MDM  Received patient signout.  Epigastric to right upper quadrant to lower chest pain.  Work-up reassuring.  X-ray done reassuring.  Doubt cardiac cause.  Discharge home.       Davonna Belling, MD 02/14/20 [phone]

## 2020-02-14 NOTE — ED Triage Notes (Signed)
Pt C/O epigastric pain that started Sunday morning. Pt reports dry heaves. Denies fevers.

## 2020-02-14 NOTE — Discharge Instructions (Signed)
Take the stomach medication as prescribed.  Avoid alcohol, caffeine, NSAID medication such as ibuprofen, naproxen, or aspirin.  Follow-up with your doctor.  Return to the ED if you have new or worsening symptoms.

## 2020-04-06 ENCOUNTER — Ambulatory Visit
Admission: RE | Admit: 2020-04-06 | Discharge: 2020-04-06 | Disposition: A | Discharge status: Home / Self Care | Payer: Medicaid Other | Source: Ambulatory Visit | Attending: Physician Assistant | Admitting: Physician Assistant

## 2020-04-06 ENCOUNTER — Other Ambulatory Visit: Payer: Self-pay | Admitting: Physician Assistant

## 2020-04-06 DIAGNOSIS — T1490XA Injury, unspecified, initial encounter: Secondary | ICD-10-CM

## 2020-04-28 ENCOUNTER — Ambulatory Visit
Admission: RE | Admit: 2020-04-28 | Discharge: 2020-04-28 | Disposition: A | Discharge status: Home / Self Care | Payer: Medicaid Other | Source: Ambulatory Visit | Attending: Physician Assistant | Admitting: Physician Assistant

## 2020-04-28 ENCOUNTER — Other Ambulatory Visit: Payer: Self-pay | Admitting: Physician Assistant

## 2020-04-28 DIAGNOSIS — W19XXXA Unspecified fall, initial encounter: Secondary | ICD-10-CM

## 2020-10-13 ENCOUNTER — Other Ambulatory Visit: Payer: Self-pay

## 2020-10-13 ENCOUNTER — Ambulatory Visit
Admission: EM | Admit: 2020-10-13 | Discharge: 2020-10-13 | Disposition: A | Discharge status: Home / Self Care | Payer: Medicaid Other | Attending: Family Medicine | Admitting: Family Medicine

## 2020-10-13 DIAGNOSIS — H6693 Otitis media, unspecified, bilateral: Secondary | ICD-10-CM

## 2020-10-13 MED ORDER — SULFAMETHOXAZOLE-TRIMETHOPRIM 800-160 MG PO TABS: 1.0000 | ORAL_TABLET | Freq: Two times a day (BID) | ORAL | 0 refills | Status: AC

## 2020-10-13 NOTE — ED Triage Notes (Signed)
Pt states that she has some right ear  Pain x2 weeks. Pt states that the left ear is starting to hurt as well. Pt states that she has vertigo as well. Pt states that she is not vaccinated.

## 2020-10-13 NOTE — ED Provider Notes (Signed)
[facility]    CSN: [id number] Arrival date & time: 10/13/20  1020      History   Chief Complaint Chief Complaint  Patient presents with  . Ear Pain    HPI Jade Kelly is a 34 y.o. female.   HPI Patient presents today with right ear pain with intermittent vertigo type symptoms.  There is a chronic recurrent issue stemming back to October of this year.  She has tried multiple homeopathic remedies to resolve ear pain which have been ineffective.  Most recently she did place alcohol on her ear and had severe burning following installation of alcohol.  Today she awakened with left-sided ear pain.  Denies inability to hear but feels ears are clogged.  Denies any other URI symptoms or recent URI illness.  She is unvaccinated and feels she does not have Covid and has not had exposure to Covid. Past Medical History:  Diagnosis Date  . Anxiety   . Asthma    prn inhaler  . Breast mass, right 11/2013  . Depression   . Family history of anesthesia complication    maternal grandmother has hx. of post-op nausea  . GERD (gastroesophageal reflux disease)     Patient Active Problem List   Diagnosis Date Noted  . Post-operative state 12/17/2013  . Metatarsal fracture 06/30/2012    Past Surgical History:  Procedure Laterality Date  . BREAST LUMPECTOMY WITH RADIOACTIVE SEED LOCALIZATION Right 11/26/2018   Procedure: RIGHT BREAST LUMPECTOMY WITH RADIOACTIVE SEED LOCALIZATION;  Surgeon: Erroll Luna, MD;  Location: [place];  Service: General;  Laterality: Right;  . EXCISION OF BREAST BIOPSY Right 12/02/2013   Procedure: RIGHT EXCISIONAL BREAST BIOPSY;  Surgeon: Marcello Moores A. Cornett, MD;  Location: [place];  Service: General;  Laterality: Right;  . WISDOM TOOTH EXTRACTION      OB History   No obstetric history on file.      Home Medications    Prior to Admission medications   Medication Sig Start Date End Date Taking? Authorizing Provider  albuterol  (VENTOLIN HFA) 108 (90 Base) MCG/ACT inhaler Inhale 2 puffs into the lungs every 6 (six) hours as needed for wheezing or shortness of breath.    Yes [provider]  buPROPion (WELLBUTRIN SR) 150 MG 12 hr tablet Take 150 mg by mouth daily.   Yes [provider]  FLUoxetine (PROZAC) 20 MG capsule Take 60 mg by mouth daily.   Yes [provider]  ALPRAZolam (XANAX) 0.25 MG tablet Take 0.25 mg by mouth daily as needed for anxiety.    [provider]  aspirin 81 MG chewable tablet Chew 81 mg by mouth once.    [provider]  clindamycin (CLEOCIN T) 1 % external solution Apply 1 application topically daily. Applied to face 11/17/18   [provider]  HYDROcodone-acetaminophen (NORCO/VICODIN) 5-325 MG tablet Take 1 tablet by mouth every 6 (six) hours as needed for moderate pain. 11/26/18   Cornett, Marcello Moores, MD  ibuprofen (ADVIL,MOTRIN) 800 MG tablet Take 1 tablet (800 mg total) by mouth every 8 (eight) hours as needed. 11/26/18   Cornett, Marcello Moores, MD  omeprazole (PRILOSEC) 20 MG capsule Take 1 capsule (20 mg total) by mouth daily. 02/14/20   Rancour, Annie Main, MD  ondansetron (ZOFRAN-ODT) 4 MG disintegrating tablet Take 1 tablet (4 mg total) by mouth every 8 (eight) hours as needed for nausea or vomiting. 02/14/20   Davonna Belling, MD    Family History Family History  Problem  Relation Age of Onset  . Anesthesia problems Maternal Grandmother        post-op nausea    Social History Social History   Tobacco Use  . Smoking status: Never Smoker  . Smokeless tobacco: Never Used  Vaping Use  . Vaping Use: Never used  Substance Use Topics  . Alcohol use: No  . Drug use: No     Allergies   Penicillins   Review of Systems Review of Systems Pertinent negatives listed in HPI  Physical Exam Triage Vital Signs ED Triage Vitals  Enc Vitals Group     BP 10/13/20 1052 126/83     Pulse Rate 10/13/20 1052 87     Resp --      Temp 10/13/20 1052  99 F (37.2 C)     Temp Source 10/13/20 1052 Oral     SpO2 10/13/20 1052 97 %     Weight 10/13/20 1050 207 lb (93.9 kg)     Height 10/13/20 1050 5\' 7"  (1.702 m)     Head Circumference --      Peak Flow --      Pain Score 10/13/20 1049 2     Pain Loc --      Pain Edu? --      Excl. in [place]? --    No data found.  Updated Vital Signs BP 126/83 (BP Location: Right Arm)   Pulse 87   Temp 99 F (37.2 C) (Oral)   Ht 5\' 7"  (1.702 m)   Wt 207 lb (93.9 kg)   LMP 09/13/2020   SpO2 97%   BMI 32.42 kg/m   Visual Acuity Right Eye Distance:   Left Eye Distance:   Bilateral Distance:    Right Eye Near:   Left Eye Near:    Bilateral Near:     Physical Exam Constitutional:      Appearance: Normal appearance. She is ill-appearing.  HENT:     Right Ear: Decreased hearing noted. Tympanic membrane is erythematous. Tympanic membrane has normal mobility.     Left Ear: Decreased hearing noted. A middle ear effusion is present. Tympanic membrane is erythematous. Tympanic membrane has normal mobility.  Cardiovascular:     Rate and Rhythm: Normal rate and regular rhythm.  Pulmonary:     Effort: Pulmonary effort is normal.     Breath sounds: Normal breath sounds.  Neurological:     Mental Status: She is alert.      UC Treatments / Results  Labs (all labs ordered are listed, but only abnormal results are displayed) Labs Reviewed - No data to display  EKG   Radiology No results found.  Procedures Procedures (including critical care time)  Medications Ordered in UC Medications - No data to display  Initial Impression / Assessment and Plan / UC Course  I have reviewed the triage vital signs and the nursing notes.  Pertinent labs & imaging results that were available during my care of the patient were reviewed by me and considered in my medical decision making (see chart for details).     Acute otitis media bilateral.  Covering with Bactrim given patient's penicillin allergy.   Complete medication as prescribed.  Avoid instilling any other substances into the ear as this could worsen infection.  Patient opted out of COVID-19 testing.  Continue to monitor for worsening of symptoms follow-up with primary care if symptoms do not resolve  Final Clinical Impressions(s) / UC Diagnoses   Final diagnoses:  Acute bacterial infection  of both middle ears   Discharge Instructions   None    ED Prescriptions    Medication Sig Dispense Auth. Provider   sulfamethoxazole-trimethoprim (BACTRIM DS) 800-160 MG tablet Take 1 tablet by mouth 2 (two) times daily for 7 days. 14 tablet Scot Jun, FNP     PDMP not reviewed this encounter.   Scot Jun, FNP 10/13/20 1257

## 2020-10-22 ENCOUNTER — Ambulatory Visit
Admission: EM | Admit: 2020-10-22 | Discharge: 2020-10-22 | Disposition: A | Discharge status: Home / Self Care | Payer: Medicaid Other | Attending: Emergency Medicine | Admitting: Emergency Medicine

## 2020-10-22 ENCOUNTER — Other Ambulatory Visit: Payer: Self-pay

## 2020-10-22 ENCOUNTER — Encounter: Payer: Self-pay | Admitting: Emergency Medicine

## 2020-10-22 DIAGNOSIS — Z1152 Encounter for screening for COVID-19: Secondary | ICD-10-CM | POA: Diagnosis not present

## 2020-10-22 DIAGNOSIS — J069 Acute upper respiratory infection, unspecified: Secondary | ICD-10-CM | POA: Diagnosis not present

## 2020-10-22 MED ORDER — CETIRIZINE HCL 10 MG PO TABS: 10.0000 mg | ORAL_TABLET | Freq: Every day | ORAL | 0 refills | Status: DC

## 2020-10-22 MED ORDER — BENZONATATE 100 MG PO CAPS: 100.0000 mg | ORAL_CAPSULE | Freq: Three times a day (TID) | ORAL | 0 refills | Status: DC | PRN

## 2020-10-22 MED ORDER — PREDNISONE 10 MG PO TABS: 20.0000 mg | ORAL_TABLET | Freq: Every day | ORAL | 0 refills | Status: DC

## 2020-10-22 MED ORDER — FLUTICASONE PROPIONATE 50 MCG/ACT NA SUSP: 1.0000 | Freq: Every day | NASAL | 0 refills | Status: DC

## 2020-10-22 NOTE — Discharge Instructions (Signed)
COVID testing ordered.  It will take between 2-7 days for test results.  Someone will contact you regarding abnormal results.    In the meantime: You should remain isolated in your home for 10 days from symptom onset AND greater than 24 hours after symptoms resolution (absence of fever without the use of fever-reducing medication and improvement in respiratory symptoms), whichever is longer Get plenty of rest and push fluids Tessalon Perles prescribed for cough Zyrtec for nasal congestion, runny nose, and/or sore throat Flonase for nasal congestion and runny nose Prednisone was prescribed Use medications daily for symptom relief Use OTC medications like ibuprofen or tylenol as needed fever or pain Call or go to the ED if you have any new or worsening symptoms such as fever, worsening cough, shortness of breath, chest tightness, chest pain, turning blue, changes in mental status, etc..Marland Kitchen

## 2020-10-22 NOTE — ED Provider Notes (Signed)
[place]   [id number] 10/22/20 Arrival Time: 4098   CC: COVID symptoms  SUBJECTIVE: History from: patient.  Jade Kelly is a 34 y.o. female who presented to the urgent care with a complaint of chills fever, nasal congestion for the past few days.  Denies sick exposure to COVID, flu or strep.  Denies recent travel.  Has tried OTC medication without relief.  Denies aggravating factors.  Denies previous symptoms in the past.   Denies fever, chills, fatigue, sinus pain, rhinorrhea, sore throat, SOB, wheezing, chest pain, nausea, changes in bowel or bladder habits.     ROS: As per HPI.  All other pertinent ROS negative.      Past Medical History:  Diagnosis Date  . Anxiety   . Asthma    prn inhaler  . Breast mass, right 11/2013  . Depression   . Family history of anesthesia complication    maternal grandmother has hx. of post-op nausea  . GERD (gastroesophageal reflux disease)    Past Surgical History:  Procedure Laterality Date  . BREAST LUMPECTOMY WITH RADIOACTIVE SEED LOCALIZATION Right 11/26/2018   Procedure: RIGHT BREAST LUMPECTOMY WITH RADIOACTIVE SEED LOCALIZATION;  Surgeon: Erroll Luna, MD;  Location: [place];  Service: General;  Laterality: Right;  . EXCISION OF BREAST BIOPSY Right 12/02/2013   Procedure: RIGHT EXCISIONAL BREAST BIOPSY;  Surgeon: Marcello Moores A. Cornett, MD;  Location: [place];  Service: General;  Laterality: Right;  . WISDOM TOOTH EXTRACTION     Allergies  Allergen Reactions  . Penicillins Rash and Other (See Comments)    DID THE REACTION INVOLVE: Swelling of the face/tongue/throat, SOB, or low BP? No Sudden or SEVERE rash/hives, skin peeling, or the inside of the mouth or nose? Yes Did it require medical treatment? No When did it last happen? In the 90s If all above answers are "NO", may proceed with cephalosporin use.    No current facility-administered medications on file prior to encounter.   Current Outpatient  Medications on File Prior to Encounter  Medication Sig Dispense Refill  . albuterol (VENTOLIN HFA) 108 (90 Base) MCG/ACT inhaler Inhale 2 puffs into the lungs every 6 (six) hours as needed for wheezing or shortness of breath.     . ALPRAZolam (XANAX) 0.25 MG tablet Take 0.25 mg by mouth daily as needed for anxiety.    Marland Kitchen aspirin 81 MG chewable tablet Chew 81 mg by mouth once.    Marland Kitchen buPROPion (WELLBUTRIN SR) 150 MG 12 hr tablet Take 150 mg by mouth daily.    . clindamycin (CLEOCIN T) 1 % external solution Apply 1 application topically daily. Applied to face    . FLUoxetine (PROZAC) 20 MG capsule Take 60 mg by mouth daily.    Marland Kitchen HYDROcodone-acetaminophen (NORCO/VICODIN) 5-325 MG tablet Take 1 tablet by mouth every 6 (six) hours as needed for moderate pain. 10 tablet 0  . ibuprofen (ADVIL,MOTRIN) 800 MG tablet Take 1 tablet (800 mg total) by mouth every 8 (eight) hours as needed. 30 tablet 0  . omeprazole (PRILOSEC) 20 MG capsule Take 1 capsule (20 mg total) by mouth daily. 30 capsule 0  . ondansetron (ZOFRAN-ODT) 4 MG disintegrating tablet Take 1 tablet (4 mg total) by mouth every 8 (eight) hours as needed for nausea or vomiting. 8 tablet 0   Social History   Socioeconomic History  . Marital status: Single    Spouse name: Not on file  . Number of children: Not on file  . Years of  education: 3  . Highest education level: Not on file  Occupational History  . Not on file  Tobacco Use  . Smoking status: Never Smoker  . Smokeless tobacco: Never Used  Vaping Use  . Vaping Use: Never used  Substance and Sexual Activity  . Alcohol use: No  . Drug use: No  . Sexual activity: Never  Other Topics Concern  . Not on file  Social History Narrative  . Not on file   Social Determinants of Health   Financial Resource Strain: Not on file  Food Insecurity: Not on file  Transportation Needs: Not on file  Physical Activity: Not on file  Stress: Not on file  Social Connections: Not on file   Intimate Partner Violence: Not on file   Family History  Problem Relation Age of Onset  . Anesthesia problems Maternal Grandmother        post-op nausea    OBJECTIVE:  Vitals:   10/22/20 1623 10/22/20 1636  BP:  114/86  Pulse: (!) 101   Resp: 15   Temp: 99.4 F (37.4 C)   TempSrc: Oral   SpO2: 95%      General appearance: alert; appears fatigued, but nontoxic; speaking in full sentences and tolerating own secretions HEENT: NCAT; Ears: EACs clear, TMs pearly gray; Eyes: PERRL.  EOM grossly intact. Sinuses: nontender; Nose: nares patent without rhinorrhea, Throat: oropharynx clear, tonsils non erythematous or enlarged, uvula midline  Neck: supple without LAD Lungs: unlabored respirations, symmetrical air entry; cough: moderate; no respiratory distress; CTAB Heart: regular rate and rhythm.  Radial pulses 2+ symmetrical bilaterally Skin: warm and dry Psychological: alert and cooperative; normal mood and affect  LABS:  No results found for this or any previous visit (from the past 24 hour(s)).   ASSESSMENT & PLAN:  1. URI with cough and congestion   2. Encounter for screening for COVID-19     Meds ordered this encounter  Medications  . predniSONE (DELTASONE) 10 MG tablet    Sig: Take 2 tablets (20 mg total) by mouth daily.    Dispense:  15 tablet    Refill:  0  . benzonatate (TESSALON) 100 MG capsule    Sig: Take 1 capsule (100 mg total) by mouth 3 (three) times daily as needed for cough.    Dispense:  30 capsule    Refill:  0  . cetirizine (ZYRTEC ALLERGY) 10 MG tablet    Sig: Take 1 tablet (10 mg total) by mouth daily.    Dispense:  30 tablet    Refill:  0  . fluticasone (FLONASE) 50 MCG/ACT nasal spray    Sig: Place 1 spray into both nostrils daily for 14 days.    Dispense:  16 g    Refill:  0    Discharge Instructions    COVID testing ordered.  It will take between 2-7 days for test results.  Someone will contact you regarding abnormal results.    In  the meantime: You should remain isolated in your home for 10 days from symptom onset AND greater than 24 hours after symptoms resolution (absence of fever without the use of fever-reducing medication and improvement in respiratory symptoms), whichever is longer Get plenty of rest and push fluids Tessalon Perles prescribed for cough Zyrtec for nasal congestion, runny nose, and/or sore throat Flonase for nasal congestion and runny nose Prednisone was prescribed Use medications daily for symptom relief Use OTC medications like ibuprofen or tylenol as needed fever or pain Call or  go to the ED if you have any new or worsening symptoms such as fever, worsening cough, shortness of breath, chest tightness, chest pain, turning blue, changes in mental status, etc...   Reviewed expectations re: course of current medical issues. Questions answered. Outlined signs and symptoms indicating need for more acute intervention. Patient verbalized understanding. After Visit Summary given.         Emerson Monte, [place] 10/22/20 1656

## 2020-10-22 NOTE — ED Triage Notes (Signed)
Pt seen here on 12/10 given meds for ear infection.  Started having headaches, eye pain, nasal congestion, chills and skin is crawling x 2day. Seen at her pcp and was told she could be having a reaction to medication she was taking.

## 2020-10-24 LAB — COVID-19, FLU A+B NAA
Influenza A, NAA: NOT DETECTED
Influenza B, NAA: NOT DETECTED
SARS-CoV-2, NAA: DETECTED — AB

## 2020-10-25 ENCOUNTER — Ambulatory Visit (HOSPITAL_COMMUNITY): Payer: Medicaid Other

## 2020-10-25 ENCOUNTER — Encounter: Payer: Self-pay | Admitting: Oncology

## 2020-10-25 ENCOUNTER — Other Ambulatory Visit: Payer: Self-pay | Admitting: Oncology

## 2020-10-25 DIAGNOSIS — U071 COVID-19: Secondary | ICD-10-CM

## 2020-10-25 NOTE — Progress Notes (Signed)
I connected by phone with  Mrs. Bermingham to discuss the potential use of an new treatment for mild to moderate COVID-19 viral infection in non-hospitalized patients.   This patient is a age/sex that meets the FDA criteria for Emergency Use Authorization of casirivimab\imdevimab.  Has a (+) direct SARS-CoV-2 viral test result 1. Has mild or moderate COVID-19  2. Is ? 33 years of age and weighs ? 40 kg 3. Is NOT hospitalized due to COVID-19 4. Is NOT requiring oxygen therapy or requiring an increase in baseline oxygen flow rate due to COVID-19 5. Is within 10 days of symptom onset 6. Has at least one of the high risk factor(s) for progression to severe COVID-19 and/or hospitalization as defined in EUA. Specific high risk criteria : Past Medical History:  Diagnosis Date  . Anxiety   . Asthma    prn inhaler  . Breast mass, right 11/2013  . Depression   . Family history of anesthesia complication    maternal grandmother has hx. of post-op nausea  . GERD (gastroesophageal reflux disease)   ?  ?    Symptom onset  10/19/20   I have spoken and communicated the following to the patient or parent/caregiver:   1. FDA has authorized the emergency use of casirivimab\imdevimab for the treatment of mild to moderate COVID-19 in adults and pediatric patients with positive results of direct SARS-CoV-2 viral testing who are 43 years of age and older weighing at least 40 kg, and who are at high risk for progressing to severe COVID-19 and/or hospitalization.   2. The significant known and potential risks and benefits of casirivimab\imdevimab, and the extent to which such potential risks and benefits are unknown.   3. Information on available alternative treatments and the risks and benefits of those alternatives, including clinical trials.   4. Patients treated with casirivimab\imdevimab should continue to self-isolate and use infection control measures (e.g., wear mask, isolate, social distance, avoid  sharing personal items, clean and disinfect "high touch" surfaces, and frequent handwashing) according to CDC guidelines.    5. The patient or parent/caregiver has the option to accept or refuse casirivimab\imdevimab .   After reviewing this information with the patient, The patient agreed to proceed with receiving casirivimab\imdevimab infusion and will be provided a copy of the Fact sheet prior to receiving the infusion.Rulon Abide, AGNP-C [phone] ([place])

## 2020-10-26 ENCOUNTER — Ambulatory Visit (HOSPITAL_COMMUNITY)
Admission: RE | Admit: 2020-10-26 | Discharge: 2020-10-26 | Disposition: A | Discharge status: Home / Self Care | Payer: Medicaid Other | Source: Ambulatory Visit | Attending: Pulmonary Disease | Admitting: Pulmonary Disease

## 2020-10-26 DIAGNOSIS — U071 COVID-19: Secondary | ICD-10-CM

## 2020-10-26 MED ORDER — EPINEPHRINE 0.3 MG/0.3ML IJ SOAJ: 0.3000 mg | Freq: Once | INTRAMUSCULAR | Status: DC | PRN

## 2020-10-26 MED ORDER — SODIUM CHLORIDE 0.9 % IV SOLN: INTRAVENOUS | Status: DC | PRN

## 2020-10-26 MED ORDER — DIPHENHYDRAMINE HCL 50 MG/ML IJ SOLN: 50.0000 mg | Freq: Once | INTRAMUSCULAR | Status: DC | PRN

## 2020-10-26 MED ORDER — FAMOTIDINE IN NACL 20-0.9 MG/50ML-% IV SOLN: 20.0000 mg | Freq: Once | INTRAVENOUS | Status: DC | PRN

## 2020-10-26 MED ORDER — SODIUM CHLORIDE 0.9 % IV SOLN: Freq: Once | INTRAVENOUS | Status: AC

## 2020-10-26 MED ORDER — ALBUTEROL SULFATE HFA 108 (90 BASE) MCG/ACT IN AERS: 2.0000 | INHALATION_SPRAY | Freq: Once | RESPIRATORY_TRACT | Status: DC | PRN

## 2020-10-26 MED ORDER — METHYLPREDNISOLONE SODIUM SUCC 125 MG IJ SOLR: 125.0000 mg | Freq: Once | INTRAMUSCULAR | Status: DC | PRN

## 2020-10-26 NOTE — Progress Notes (Signed)
Patient reviewed Fact Sheet for Patients, Parents, and Caregivers for Emergency Use Authorization (EUA) of REGEN-COV for the Treatment of Coronavirus. Patient also reviewed and is agreeable to the estimated cost of treatment. Patient is agreeable to proceed.    

## 2020-10-26 NOTE — Discharge Instructions (Signed)
10 Things You Can Do to Manage Your COVID-19 Symptoms at Home If you have possible or confirmed COVID-19: 1. Stay home from work and school. And stay away from other public places. If you must go out, avoid using any kind of public transportation, ridesharing, or taxis. 2. Monitor your symptoms carefully. If your symptoms get worse, call your healthcare provider immediately. 3. Get rest and stay hydrated. 4. If you have a medical appointment, call the healthcare provider ahead of time and tell them that you have or may have COVID-19. 5. For medical emergencies, call 911 and notify the dispatch personnel that you have or may have COVID-19. 6. Cover your cough and sneezes with a tissue or use the inside of your elbow. 7. Wash your hands often with soap and water for at least 20 seconds or clean your hands with an alcohol-based hand sanitizer that contains at least 60% alcohol. 8. As much as possible, stay in a specific room and away from other people in your home. Also, you should use a separate bathroom, if available. If you need to be around other people in or outside of the home, wear a mask. 9. Avoid sharing personal items with other people in your household, like dishes, towels, and bedding. 10. Clean all surfaces that are touched often, like counters, tabletops, and doorknobs. Use household cleaning sprays or wipes according to the label instructions. cdc.gov/coronavirus 05/05/2019 This information is not intended to replace advice given to you by your health care provider. Make sure you discuss any questions you have with your health care provider. Document Revised: 10/07/2019 Document Reviewed: 10/07/2019 Elsevier Patient Education  2020 Elsevier Inc. What types of side effects do monoclonal antibody drugs cause?  Common side effects  In general, the more common side effects caused by monoclonal antibody drugs include: . Allergic reactions, such as hives or itching . Flu-like signs and  symptoms, including chills, fatigue, fever, and muscle aches and pains . Nausea, vomiting . Diarrhea . Skin rashes . Low blood pressure   The CDC is recommending patients who receive monoclonal antibody treatments wait at least 90 days before being vaccinated.  Currently, there are no data on the safety and efficacy of mRNA COVID-19 vaccines in persons who received monoclonal antibodies or convalescent plasma as part of COVID-19 treatment. Based on the estimated half-life of such therapies as well as evidence suggesting that reinfection is uncommon in the 90 days after initial infection, vaccination should be deferred for at least 90 days, as a precautionary measure until additional information becomes available, to avoid interference of the antibody treatment with vaccine-induced immune responses. If you have any questions or concerns after the infusion please call the Advanced Practice Provider on call at [phone]. This number is ONLY intended for your use regarding questions or concerns about the infusion post-treatment side-effects.  Please do not provide this number to others for use. For return to work notes please contact your primary care provider.   If someone you know is interested in receiving treatment please have them call the COVID hotline at [phone].   

## 2020-10-26 NOTE — Progress Notes (Signed)
  Diagnosis: COVID-19  Physician: Patrick Wright, MD  Procedure: [facility]fusion [facility] Med: casirivimab\imdevimab infusion - Provided patient with casirivimab\imdevimab fact sheet for patients, parents and caregivers prior to infusion.  Complications: No immediate complications noted.  Discharge: Discharged home   Jade Kelly 10/26/2020  

## 2021-07-07 IMAGING — US US ABDOMEN LIMITED
1 series · 14 of 25 positions shown · non-contrast
Comparison: None

CLINICAL DATA: Gastric region pain with nausea

EXAM:
ULTRASOUND ABDOMEN LIMITED RIGHT UPPER QUADRANT

[Series 1: us abdomen limited · 14 of 48 slices shown]
[im 1/48]
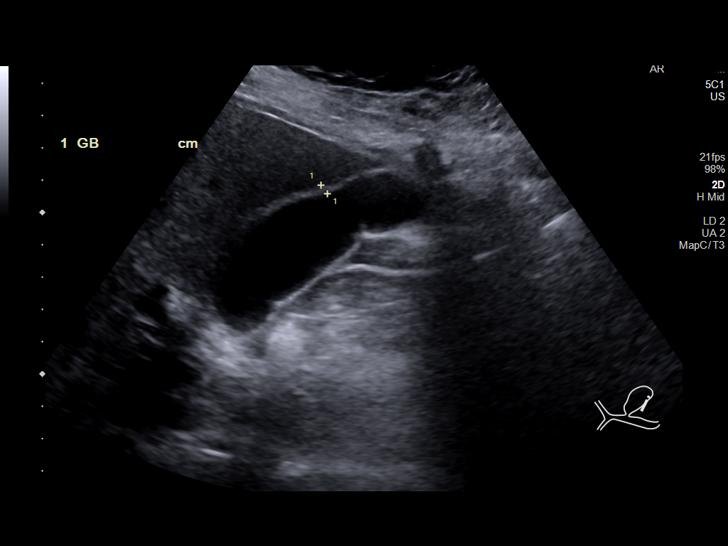
[im 4/48]
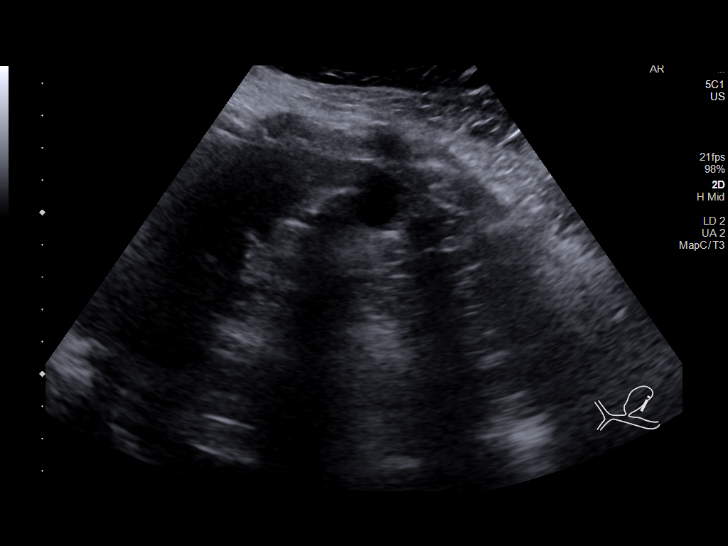
[im 8/48]
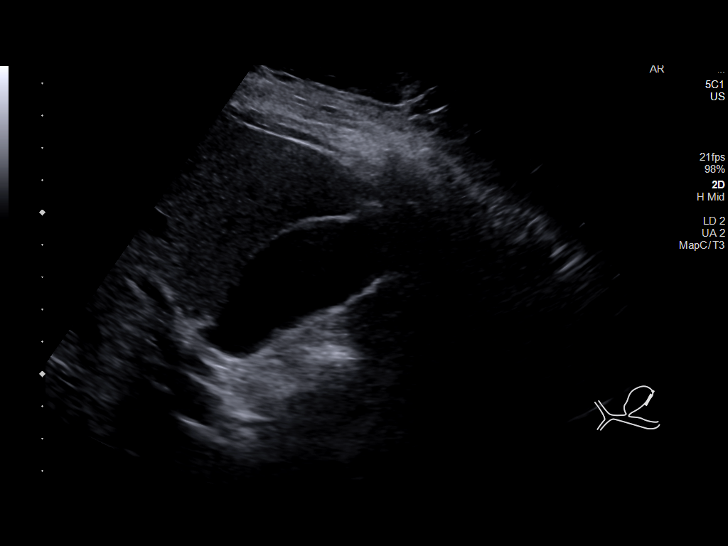
[im 12/48]
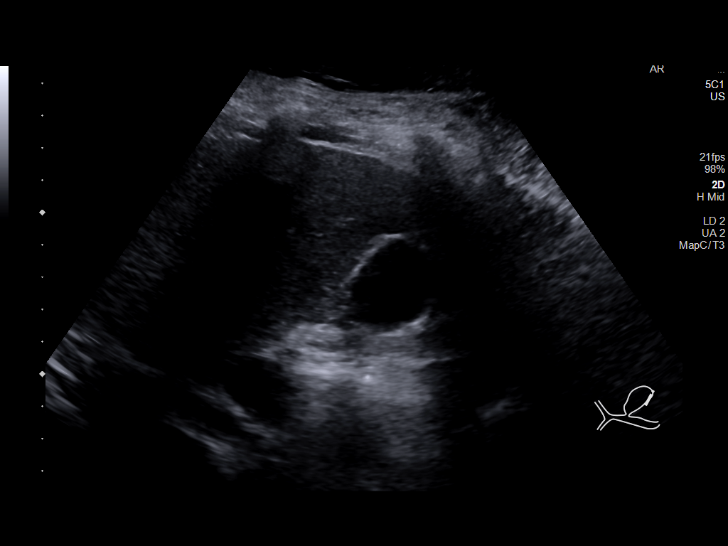
[im 16/48]
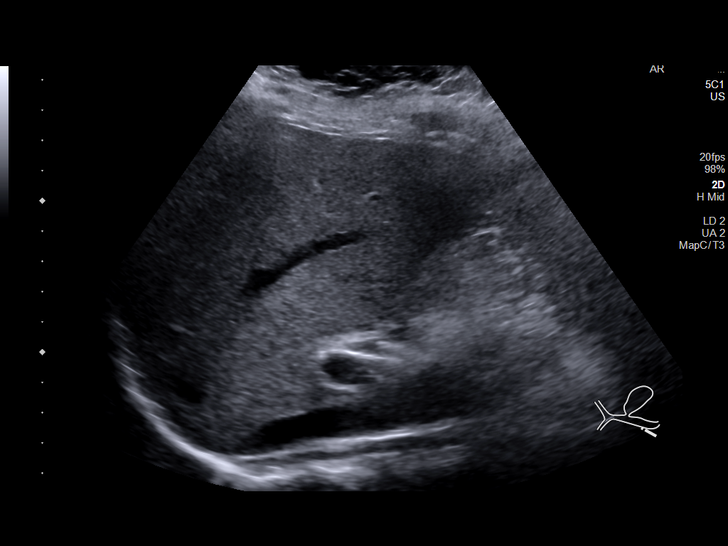
[im 18/48]
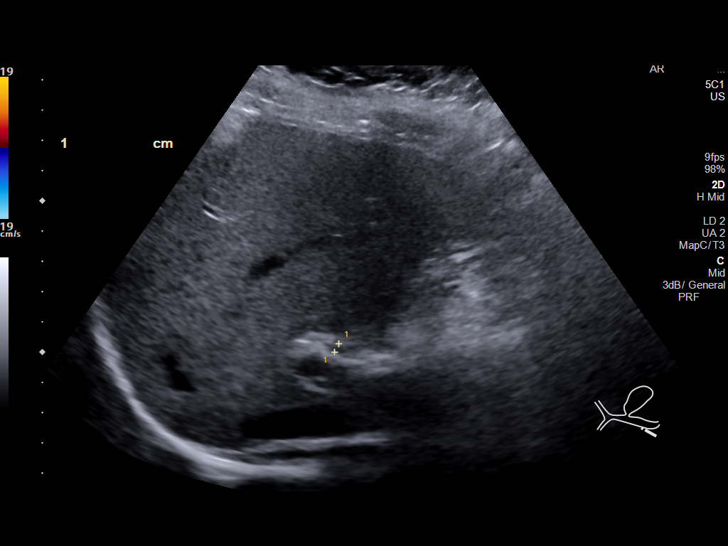
[im 22/48]
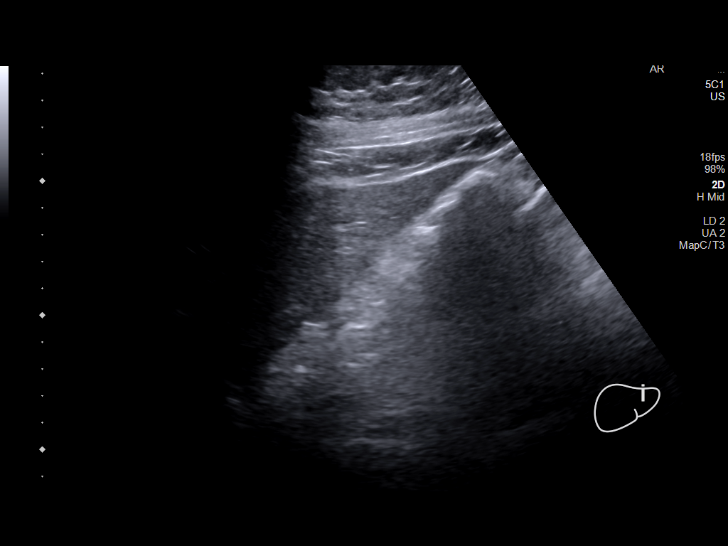
[im 26/48]
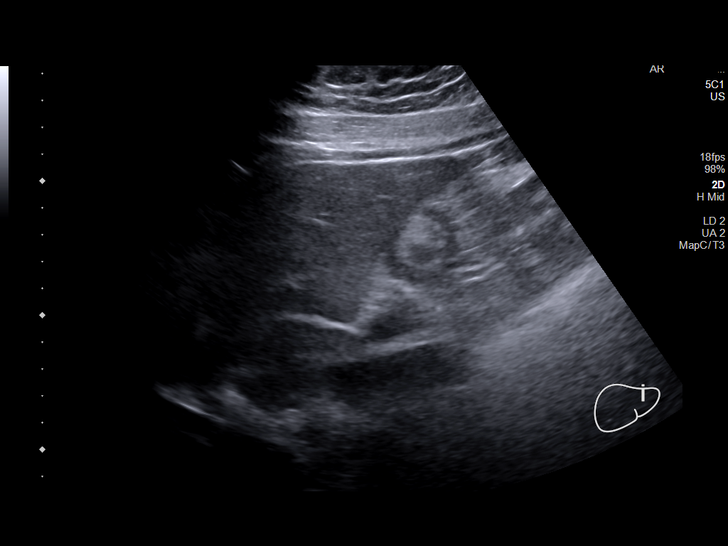
[im 30/48]
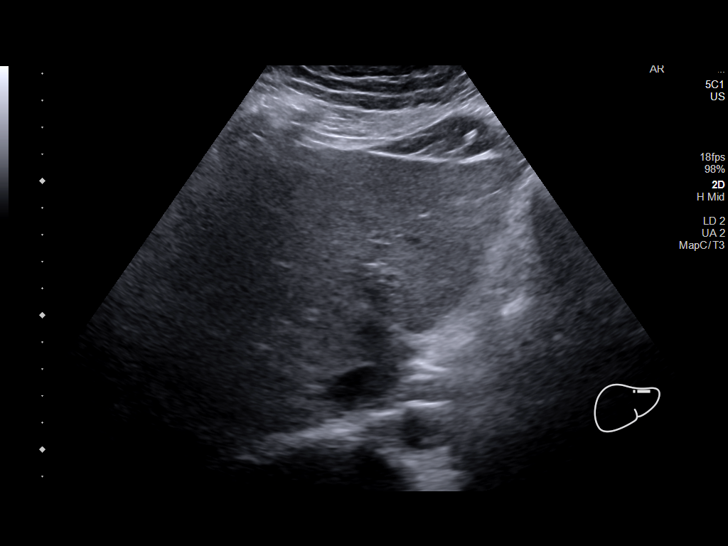
[im 32/48]
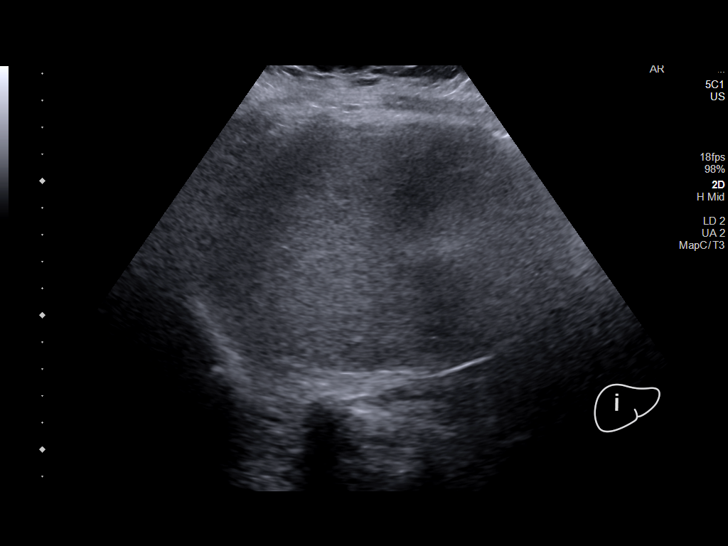
[im 36/48]
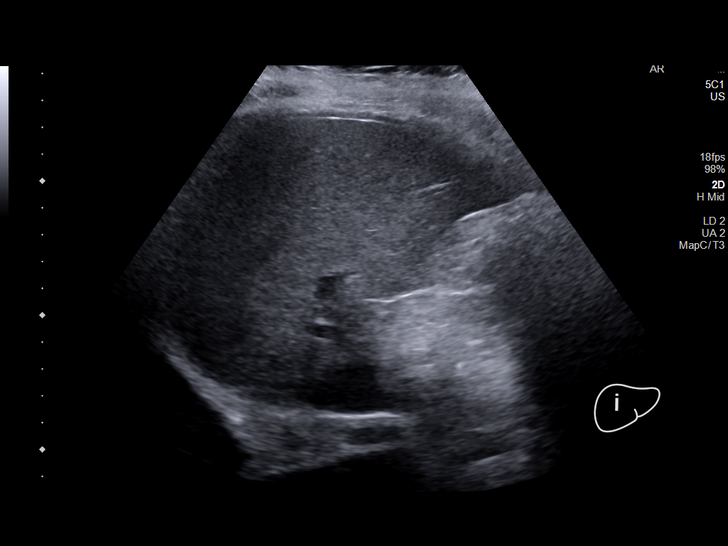
[im 40/48]
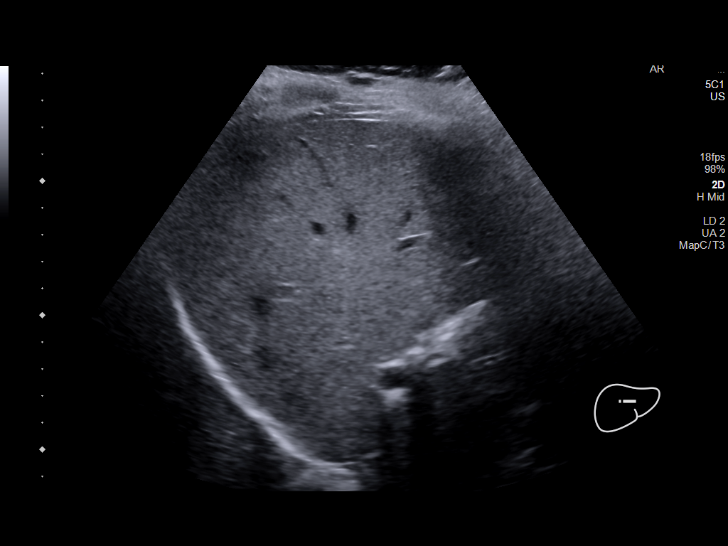
[im 44/48]
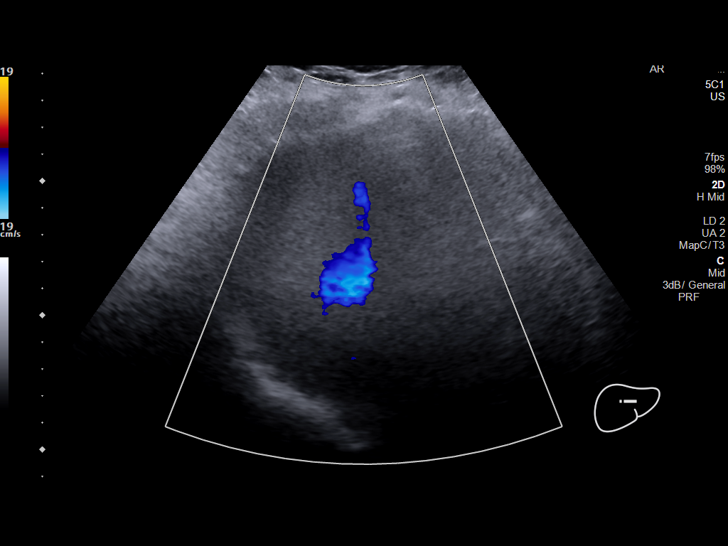
[im 48/48]
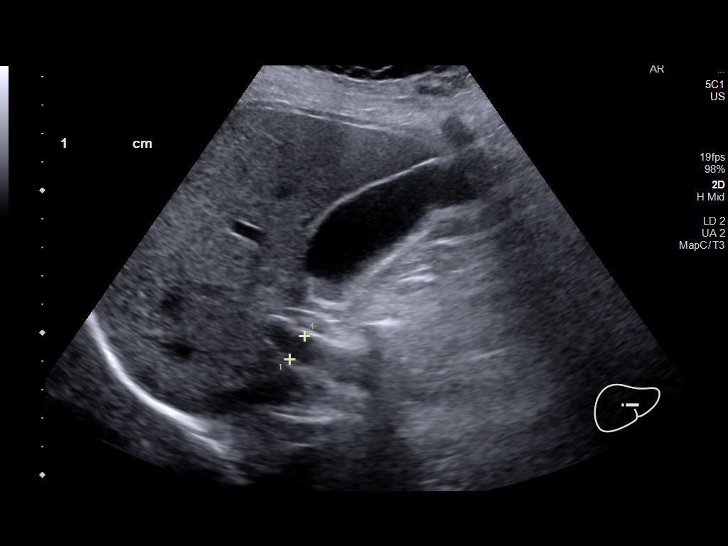

[14 of 25 positions shown; findings below may reference images not displayed]

FINDINGS: Gallbladder:

No gallstones or wall thickening visualized. There is no
pericholecystic fluid. No sonographic Murphy sign noted by
sonographer.

Common bile duct:

Diameter: 3 mm. No intrahepatic or extrahepatic biliary duct
dilatation.

Liver:

No focal lesion identified. Liver echogenicity overall is increased.
Portal vein is patent on color Doppler imaging with normal direction
of blood flow towards the liver.

Other: None.
IMPRESSION: Increased liver echogenicity, a finding indicative of a degree of
hepatic steatosis. No focal liver lesions evident.

Study otherwise unremarkable.

## 2021-08-07 DIAGNOSIS — H6123 Impacted cerumen, bilateral: Secondary | ICD-10-CM | POA: Insufficient documentation

## 2021-08-07 DIAGNOSIS — R42 Dizziness and giddiness: Secondary | ICD-10-CM | POA: Insufficient documentation

## 2021-08-07 DIAGNOSIS — H93291 Other abnormal auditory perceptions, right ear: Secondary | ICD-10-CM | POA: Insufficient documentation

## 2021-09-02 ENCOUNTER — Other Ambulatory Visit: Payer: Self-pay

## 2021-09-02 ENCOUNTER — Ambulatory Visit (HOSPITAL_COMMUNITY)
Admission: EM | Admit: 2021-09-02 | Discharge: 2021-09-02 | Disposition: A | Discharge status: Home / Self Care | Payer: Medicaid Other | Attending: Emergency Medicine | Admitting: Emergency Medicine

## 2021-09-02 ENCOUNTER — Encounter (HOSPITAL_COMMUNITY): Payer: Self-pay | Admitting: Emergency Medicine

## 2021-09-02 DIAGNOSIS — H66002 Acute suppurative otitis media without spontaneous rupture of ear drum, left ear: Secondary | ICD-10-CM

## 2021-09-02 DIAGNOSIS — H6122 Impacted cerumen, left ear: Secondary | ICD-10-CM | POA: Diagnosis not present

## 2021-09-02 MED ORDER — IBUPROFEN 600 MG PO TABS: 600.0000 mg | ORAL_TABLET | Freq: Four times a day (QID) | ORAL | 0 refills | Status: AC | PRN

## 2021-09-02 MED ORDER — AZITHROMYCIN 250 MG PO TABS: 250.0000 mg | ORAL_TABLET | Freq: Every day | ORAL | 0 refills | Status: DC

## 2021-09-02 NOTE — ED Triage Notes (Signed)
Pt c/o left ear fullness for week. Pain started today,

## 2021-09-02 NOTE — Discharge Instructions (Addendum)
Finish the antibiotics, even if you feel better.  Take 600 mg of ibuprofen combined with 1000 mg of Tylenol together 3-4 times a day as needed for pain.  Please follow-up with your ear nose throat doctor if your not getting any better.

## 2021-09-02 NOTE — ED Provider Notes (Signed)
HPI  SUBJECTIVE:  Jade Kelly is a 34 y.o. female who presents with 1 week of left ear fullness, itching.  She states that she feels as if there is "water moving in it".  She reports muffled hearing.  She reports mild nasal congestion and states that she grinds her teeth.  She has left ear pain starting today.  She describes it as dull, sore occasionally sharp and intermittent.  No new or different vertigo, fevers, allergy symptoms.  No jaw popping or clicking.  She started herself on doxycycline 100 mg twice daily 4 days ago, tried using a Q-tip where she got yellowish waxy material out of her ear, and she tried an unknown antibiotic eardrop today which made her symptoms worse.  She has a past medical history of vertigo, acne.  No history of TMJ, frequent otitis media, diabetes.  LMP: 10/17.  Denies possibility being pregnant.  CHE:NIDPOEU, Margaretha Sheffield, MD   Past Medical History:  Diagnosis Date   Anxiety    Asthma    prn inhaler   Breast mass, right 11/2013   Depression    Family history of anesthesia complication    maternal grandmother has hx. of post-op nausea   GERD (gastroesophageal reflux disease)     Past Surgical History:  Procedure Laterality Date   BREAST LUMPECTOMY WITH RADIOACTIVE SEED LOCALIZATION Right 11/26/2018   Procedure: RIGHT BREAST LUMPECTOMY WITH RADIOACTIVE SEED LOCALIZATION;  Surgeon: Erroll Luna, MD;  Location: [place];  Service: General;  Laterality: Right;   EXCISION OF BREAST BIOPSY Right 12/02/2013   Procedure: RIGHT EXCISIONAL BREAST BIOPSY;  Surgeon: Joyice Faster. Cornett, MD;  Location: [place];  Service: General;  Laterality: Right;   WISDOM TOOTH EXTRACTION      Family History  Problem Relation Age of Onset   Anesthesia problems Maternal Grandmother        post-op nausea    Social History   Tobacco Use   Smoking status: Never   Smokeless tobacco: Never  Vaping Use   Vaping Use: Never used  Substance Use Topics   Alcohol use: No    Drug use: No    No current facility-administered medications for this encounter.  Current Outpatient Medications:    albuterol (VENTOLIN HFA) 108 (90 Base) MCG/ACT inhaler, Inhale 2 puffs into the lungs every 6 (six) hours as needed for wheezing or shortness of breath. , Disp: , Rfl:    ALPRAZolam (XANAX) 0.25 MG tablet, Take 0.25 mg by mouth daily as needed for anxiety., Disp: , Rfl:    aspirin 81 MG chewable tablet, Chew 81 mg by mouth once., Disp: , Rfl:    azithromycin (ZITHROMAX Z-PAK) 250 MG tablet, Take 1 tablet (250 mg total) by mouth daily. 500 mg p.o. on day #1, 250 mg orally days 2 through 5., Disp: 6 tablet, Rfl: 0   buPROPion (WELLBUTRIN SR) 150 MG 12 hr tablet, Take 150 mg by mouth daily., Disp: , Rfl:    cetirizine (ZYRTEC ALLERGY) 10 MG tablet, Take 1 tablet (10 mg total) by mouth daily., Disp: 30 tablet, Rfl: 0   clindamycin (CLEOCIN T) 1 % external solution, Apply 1 application topically daily. Applied to face, Disp: , Rfl:    FLUoxetine (PROZAC) 20 MG capsule, Take 60 mg by mouth daily., Disp: , Rfl:    fluticasone (FLONASE) 50 MCG/ACT nasal spray, Place 1 spray into both nostrils daily for 14 days., Disp: 16 g, Rfl: 0   ibuprofen (ADVIL) 600 MG tablet, Take 1  tablet (600 mg total) by mouth every 6 (six) hours as needed., Disp: 30 tablet, Rfl: 0   omeprazole (PRILOSEC) 20 MG capsule, Take 1 capsule (20 mg total) by mouth daily., Disp: 30 capsule, Rfl: 0   ondansetron (ZOFRAN-ODT) 4 MG disintegrating tablet, Take 1 tablet (4 mg total) by mouth every 8 (eight) hours as needed for nausea or vomiting., Disp: 8 tablet, Rfl: 0  Allergies  Allergen Reactions   Penicillins Rash and Other (See Comments)    DID THE REACTION INVOLVE: Swelling of the face/tongue/throat, SOB, or low BP? No Sudden or SEVERE rash/hives, skin peeling, or the inside of the mouth or nose? Yes Did it require medical treatment? No When did it last happen? In the 90s If all above answers are "NO", may  proceed with cephalosporin use.      ROS  As noted in HPI.   Physical Exam  BP 113/79 (BP Location: Right Arm)   Pulse 95   Temp 98.2 F (36.8 C) (Oral)   Resp 17   LMP 08/20/2021   SpO2 99%   Constitutional: Well developed, well nourished, no acute distress Eyes:  EOMI, conjunctiva normal bilaterally HENT: Normocephalic, atraumatic,mucus membranes moist.  Left external ear normal.  EAC erythematous, swollen.  TM obscured with cerumen.  Decreased hearing left ear compared to right.  Pain with traction on pinna,.  No pain with palpation of tragus, palpation of mastoid.  Positive left TMJ tenderness aggravated with jaw opening.  No crepitus. Neck: No cervical lymphadenopathy. Respiratory: Normal inspiratory effort Cardiovascular: Normal rate GI: nondistended skin: No rash, skin intact Musculoskeletal: no deformities Neurologic: Alert & oriented x 3, no focal neuro deficits Psychiatric: Speech and behavior appropriate   ED Course   Medications - No data to display  Orders Placed This Encounter  Procedures   Ear wax removal    L ear only    Standing Status:   Standing    Number of Occurrences:   1    No results found for this or any previous visit (from the past 24 hour(s)). No results found.  ED Clinical Impression  1. Non-recurrent acute suppurative otitis media of left ear without spontaneous rupture of tympanic membrane   2. Hearing loss of left ear due to cerumen impaction      ED Assessment/Plan  Suspect otitis externa, however, her TM is obscured.  will have this irrigated and reevaluate.  Reevaluation, patient's hearing has improved.  Patient still with a tender, erythematous ear canal, but she states that the antibiotic eardrops made her symptoms worse.  Also, her TM is erythematous, dull and bulging.  She reports urticaria with penicillin in childhood and does not want to try a third-generation cephalosporin.  She has not responded to the doxycycline  thus far, so will change her to azithromycin Z-Pak.  She will follow-up with her ENT if she is not improving.  Discussed  MDM, treatment plan, and plan for follow-up with patient.  patient agrees with plan.   Meds ordered this encounter  Medications   azithromycin (ZITHROMAX Z-PAK) 250 MG tablet    Sig: Take 1 tablet (250 mg total) by mouth daily. 500 mg p.o. on day #1, 250 mg orally days 2 through 5.    Dispense:  6 tablet    Refill:  0   ibuprofen (ADVIL) 600 MG tablet    Sig: Take 1 tablet (600 mg total) by mouth every 6 (six) hours as needed.    Dispense:  30 tablet  Refill:  0      *This clinic note was created using [employer]. Therefore, there may be occasional mistakes despite careful proofreading.  ?    Melynda Ripple, MD 09/03/21 1130

## 2021-09-05 ENCOUNTER — Encounter (HOSPITAL_COMMUNITY): Payer: Self-pay | Admitting: Emergency Medicine

## 2021-09-05 ENCOUNTER — Emergency Department (HOSPITAL_COMMUNITY)
Admission: EM | Admit: 2021-09-05 | Discharge: 2021-09-05 | Disposition: A | Discharge status: Home / Self Care | Payer: Medicaid Other | Attending: Emergency Medicine | Admitting: Emergency Medicine

## 2021-09-05 ENCOUNTER — Other Ambulatory Visit: Payer: Self-pay

## 2021-09-05 DIAGNOSIS — Z7951 Long term (current) use of inhaled steroids: Secondary | ICD-10-CM | POA: Insufficient documentation

## 2021-09-05 DIAGNOSIS — Z7982 Long term (current) use of aspirin: Secondary | ICD-10-CM | POA: Diagnosis not present

## 2021-09-05 DIAGNOSIS — H60502 Unspecified acute noninfective otitis externa, left ear: Secondary | ICD-10-CM

## 2021-09-05 DIAGNOSIS — L700 Acne vulgaris: Secondary | ICD-10-CM | POA: Insufficient documentation

## 2021-09-05 DIAGNOSIS — H60532 Acute contact otitis externa, left ear: Secondary | ICD-10-CM | POA: Insufficient documentation

## 2021-09-05 DIAGNOSIS — K219 Gastro-esophageal reflux disease without esophagitis: Secondary | ICD-10-CM | POA: Insufficient documentation

## 2021-09-05 DIAGNOSIS — J309 Allergic rhinitis, unspecified: Secondary | ICD-10-CM | POA: Insufficient documentation

## 2021-09-05 DIAGNOSIS — F419 Anxiety disorder, unspecified: Secondary | ICD-10-CM | POA: Insufficient documentation

## 2021-09-05 DIAGNOSIS — F324 Major depressive disorder, single episode, in partial remission: Secondary | ICD-10-CM | POA: Insufficient documentation

## 2021-09-05 DIAGNOSIS — J45909 Unspecified asthma, uncomplicated: Secondary | ICD-10-CM | POA: Insufficient documentation

## 2021-09-05 DIAGNOSIS — N6009 Solitary cyst of unspecified breast: Secondary | ICD-10-CM | POA: Insufficient documentation

## 2021-09-05 DIAGNOSIS — J45990 Exercise induced bronchospasm: Secondary | ICD-10-CM | POA: Insufficient documentation

## 2021-09-05 DIAGNOSIS — H6090 Unspecified otitis externa, unspecified ear: Secondary | ICD-10-CM | POA: Insufficient documentation

## 2021-09-05 DIAGNOSIS — H9202 Otalgia, left ear: Secondary | ICD-10-CM | POA: Diagnosis present

## 2021-09-05 LAB — CBG MONITORING, ED: Glucose-Capillary: 86 mg/dL (ref 70–99)

## 2021-09-05 MED ORDER — CIPROFLOXACIN-DEXAMETHASONE 0.3-0.1 % OT SUSP: 4.0000 [drp] | Freq: Two times a day (BID) | OTIC | 0 refills | Status: DC

## 2021-09-05 MED ORDER — KETOROLAC TROMETHAMINE 30 MG/ML IJ SOLN
30.0000 mg | Freq: Once | INTRAMUSCULAR | Status: AC
  Administered 2021-09-05: 30 mg via INTRAMUSCULAR
  Filled 2021-09-05: qty 1

## 2021-09-05 MED ORDER — CIPROFLOXACIN-DEXAMETHASONE 0.3-0.1 % OT SUSP
4.0000 [drp] | Freq: Two times a day (BID) | OTIC | Status: DC
  Administered 2021-09-05: 4 [drp] via OTIC
  Filled 2021-09-05: qty 7.5

## 2021-09-05 NOTE — ED Triage Notes (Signed)
Pt c/o left ear pain and facial pain intermittent x one month.

## 2021-09-05 NOTE — ED Provider Notes (Signed)
[facility] EMERGENCY DEPARTMENT Provider Note   CSN: [id number] Arrival date & time: 09/05/21  6063     History Chief Complaint  Patient presents with   Facial Pain    Jade Kelly is a 35 y.o. female.  HPI     This is a 35 year old female with a history of asthma who presents with persistent worsening left ear pain.  Patient reports she has had issues on and off with her ears over the last 1 to 2 months.  She initially was seen and evaluated and treated for otitis externa with Cortisporin drops.  She followed up with ENT and was noted to have cerumen impaction but no acute infection.  She has had worsening pain of the left ear over the last week.  She began doxycycline and reinitiated drops with minimal relief.  She was seen and evaluated at urgent care.  She was transitioned to azithromycin.  She has not had any fevers or upper respiratory symptoms.  She states that her pain is refractory to 600 mg of ibuprofen.  She rates pain a 10 out of 10.  Pain radiates into her left TMJ.  No known history of diabetes.  Past Medical History:  Diagnosis Date   Anxiety    Asthma    prn inhaler   Breast mass, right 11/2013   Depression    Family history of anesthesia complication    maternal grandmother has hx. 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/05/2021   Otitis externa 09/05/2021   Solitary cyst of breast 09/05/2021   Abnormal auditory perception of right ear 08/07/2021   Dizziness 08/07/2021   Excessive cerumen in both ear canals 08/07/2021   Post-operative state 12/17/2013   Metatarsal fracture 06/30/2012    Past Surgical History:  Procedure Laterality Date   BREAST  LUMPECTOMY WITH RADIOACTIVE SEED LOCALIZATION Right 11/26/2018   Procedure: RIGHT BREAST LUMPECTOMY WITH RADIOACTIVE SEED LOCALIZATION;  Surgeon: Erroll Luna, MD;  Location: [place];  Service: General;  Laterality: Right;   EXCISION OF BREAST BIOPSY Right 12/02/2013   Procedure: RIGHT EXCISIONAL BREAST BIOPSY;  Surgeon: Joyice Faster. Cornett, MD;  Location: [place];  Service: General;  Laterality: Right;   WISDOM TOOTH EXTRACTION       OB History   No obstetric history on file.     Family History  Problem Relation Age of Onset   Anesthesia problems Maternal Grandmother        post-op nausea    Social History   Tobacco Use   Smoking status: Never   Smokeless tobacco: Never  Vaping Use   Vaping Use: Never used  Substance Use Topics   Alcohol use: No   Drug use: No    Home Medications Prior to Admission medications   Medication Sig Start Date End Date Taking? Authorizing Provider  ciprofloxacin-dexamethasone (CIPRODEX) OTIC suspension Place 4 drops into the left ear 2 (two) times daily. 09/05/21  Yes Blayze Haen, Barbette Hair, MD  albuterol (VENTOLIN HFA) 108 (90 Base) MCG/ACT inhaler Inhale 2 puffs into the lungs every 6 (six) hours as needed for wheezing or shortness of breath.     [provider]  ALPRAZolam Duanne Moron) 0.25 MG  tablet Take 0.25 mg by mouth daily as needed for anxiety.    [provider]  aspirin 81 MG chewable tablet Chew 81 mg by mouth once.    [provider]  azithromycin (ZITHROMAX Z-PAK) 250 MG tablet Take 1 tablet (250 mg total) by mouth daily. 500 mg p.o. on day #1, 250 mg orally days 2 through 5. 09/02/21   Melynda Ripple, MD  buPROPion [facility] SR) 150 MG 12 hr tablet Take 150 mg by mouth daily.    [provider]  cetirizine (ZYRTEC ALLERGY) 10 MG tablet Take 1 tablet (10 mg total) by mouth daily. 10/22/20   Avegno, Darrelyn Hillock, FNP  clindamycin (CLEOCIN T) 1 % external solution Apply 1 application topically  daily. Applied to face 11/17/18   [provider]  FLUoxetine (PROZAC) 20 MG capsule Take 60 mg by mouth daily.    [provider]  fluticasone (FLONASE) 50 MCG/ACT nasal spray Place 1 spray into both nostrils daily for 14 days. 10/22/20 11/05/20  Avegno, Darrelyn Hillock, FNP  ibuprofen (ADVIL) 600 MG tablet Take 1 tablet (600 mg total) by mouth every 6 (six) hours as needed. 09/02/21   Melynda Ripple, MD  omeprazole (PRILOSEC) 20 MG capsule Take 1 capsule (20 mg total) by mouth daily. 02/14/20   Rancour, Annie Main, MD  ondansetron (ZOFRAN-ODT) 4 MG disintegrating tablet Take 1 tablet (4 mg total) by mouth every 8 (eight) hours as needed for nausea or vomiting. 02/14/20   Davonna Belling, MD    Allergies    Penicillins and Sulfa antibiotics  Review of Systems   Review of Systems  Constitutional:  Negative for fever.  HENT:  Positive for ear pain. Negative for ear discharge, facial swelling and hearing loss.   Respiratory:  Negative for shortness of breath.   Cardiovascular:  Negative for chest pain.  Gastrointestinal:  Negative for abdominal pain.  All other systems reviewed and are negative.  Physical Exam Updated Vital Signs BP 124/79   Pulse 68   Temp 98.5 F (36.9 C) (Oral)   Resp 16   Ht 1.702 m (5\' 7" )   Wt 93 kg   LMP 08/20/2021   SpO2 97%   BMI 32.11 kg/m   Physical Exam Vitals and nursing note reviewed.  Constitutional:      Appearance: She is well-developed. She is obese. She is not ill-appearing.  HENT:     Head: Normocephalic and atraumatic.     Ears:     Comments: Tenderness with traction of the pinna, no external abnormalities noted, external auditory canal is edematous, slight erythema noted to the TM without bulging, no serum noted, no mastoid tenderness Eyes:     Extraocular Movements: Extraocular movements intact.     Pupils: Pupils are equal, round, and reactive to light.  Cardiovascular:     Rate and Rhythm: Normal rate and regular rhythm.   Pulmonary:     Effort: Pulmonary effort is normal. No respiratory distress.  Abdominal:     Palpations: Abdomen is soft.  Musculoskeletal:     Cervical back: Neck supple.  Skin:    General: Skin is warm and dry.  Neurological:     Mental Status: She is alert and oriented to person, place, and time.  Psychiatric:        Mood and Affect: Mood normal.    ED Results / Procedures / Treatments   Labs (all labs ordered are listed, but only abnormal results are displayed) Labs Reviewed  CBG MONITORING, ED  EKG None  Radiology No results found.  Procedures Procedures   Medications Ordered in ED Medications  ciprofloxacin-dexamethasone (CIPRODEX) 0.3-0.1 % OTIC (EAR) suspension 4 drop (4 drops Left EAR Given 09/05/21 0453)  ketorolac (TORADOL) 30 MG/ML injection 30 mg (30 mg Intramuscular Given 09/05/21 0454)    ED Course  I have reviewed the triage vital signs and the nursing notes.  Pertinent labs & imaging results that were available during my care of the patient were reviewed by me and considered in my medical decision making (see chart for details).    MDM Rules/Calculators/A&P                           Patient presents with pain of the left ear.  She has had ongoing issues for over 1 month.  She is nontoxic and afebrile.  No evidence of mastitis.  Her external auditory canal is edematous and she has pain with traction of the pinna.  Highly suggestive of otitis externa.  TM appears clear.  I reviewed her note from urgent care.  Similar documented exam although patient appears to have been treated for otitis media and her antibiotic was changed to azithromycin secondary to penicillin allergy.  Patient was given IM Toradol.  Ciprodex drops were ordered.  She will take this home with her.  Recommend close ENT follow-up given acute on chronic nature of the symptoms.  I did obtain a CBG to ensure no new onset diabetes or malignant otitis externa.  This was normal.  After  history, exam, and medical workup I feel the patient has been appropriately medically screened and is safe for discharge home. Pertinent diagnoses were discussed with the patient. Patient was given return precautions.  Final Clinical Impression(s) / ED Diagnoses Final diagnoses:  Acute otitis externa of left ear, unspecified type    Rx / DC Orders ED Discharge Orders          Ordered    ciprofloxacin-dexamethasone (CIPRODEX) OTIC suspension  2 times daily        09/05/21 0603             Austan Nicholl, Barbette Hair, MD 09/05/21 [phone]

## 2021-09-05 NOTE — Discharge Instructions (Signed)
You have evidence of infection of the external auditory canal.  Follow-up with ENT.  Continue ibuprofen scheduled for the next 2 to 3 days.  Use drops as prescribed.

## 2021-09-19 IMAGING — CR DG THORACIC SPINE 3V
3 series · 3 of 3 positions shown · non-contrast
Comparison: None.

CLINICAL DATA: Back pain after a fall.

EXAM:
THORACIC SPINE - 3 VIEWS

[t t-spine a.p.]
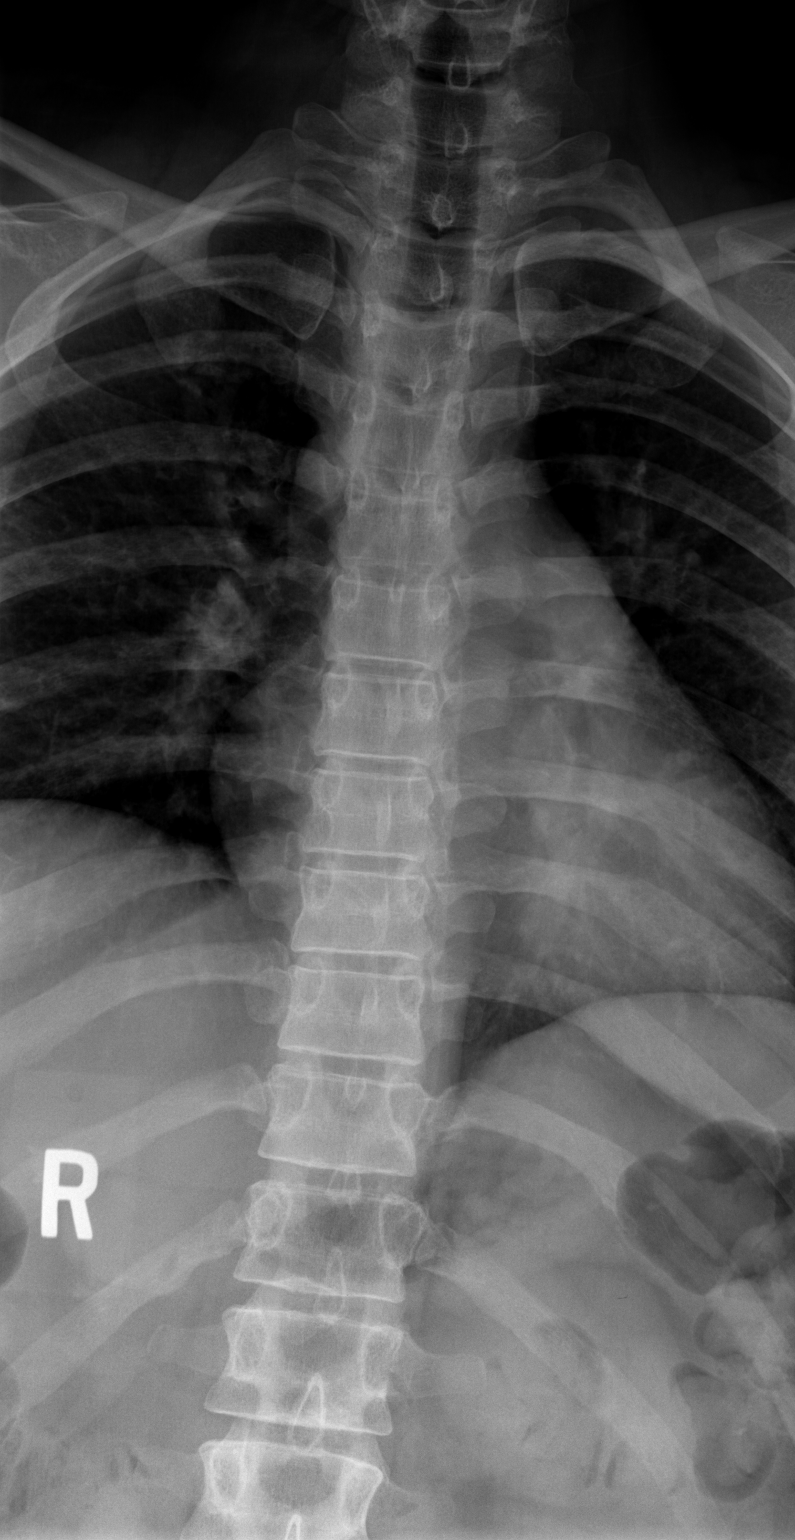

[t t-spine lat]
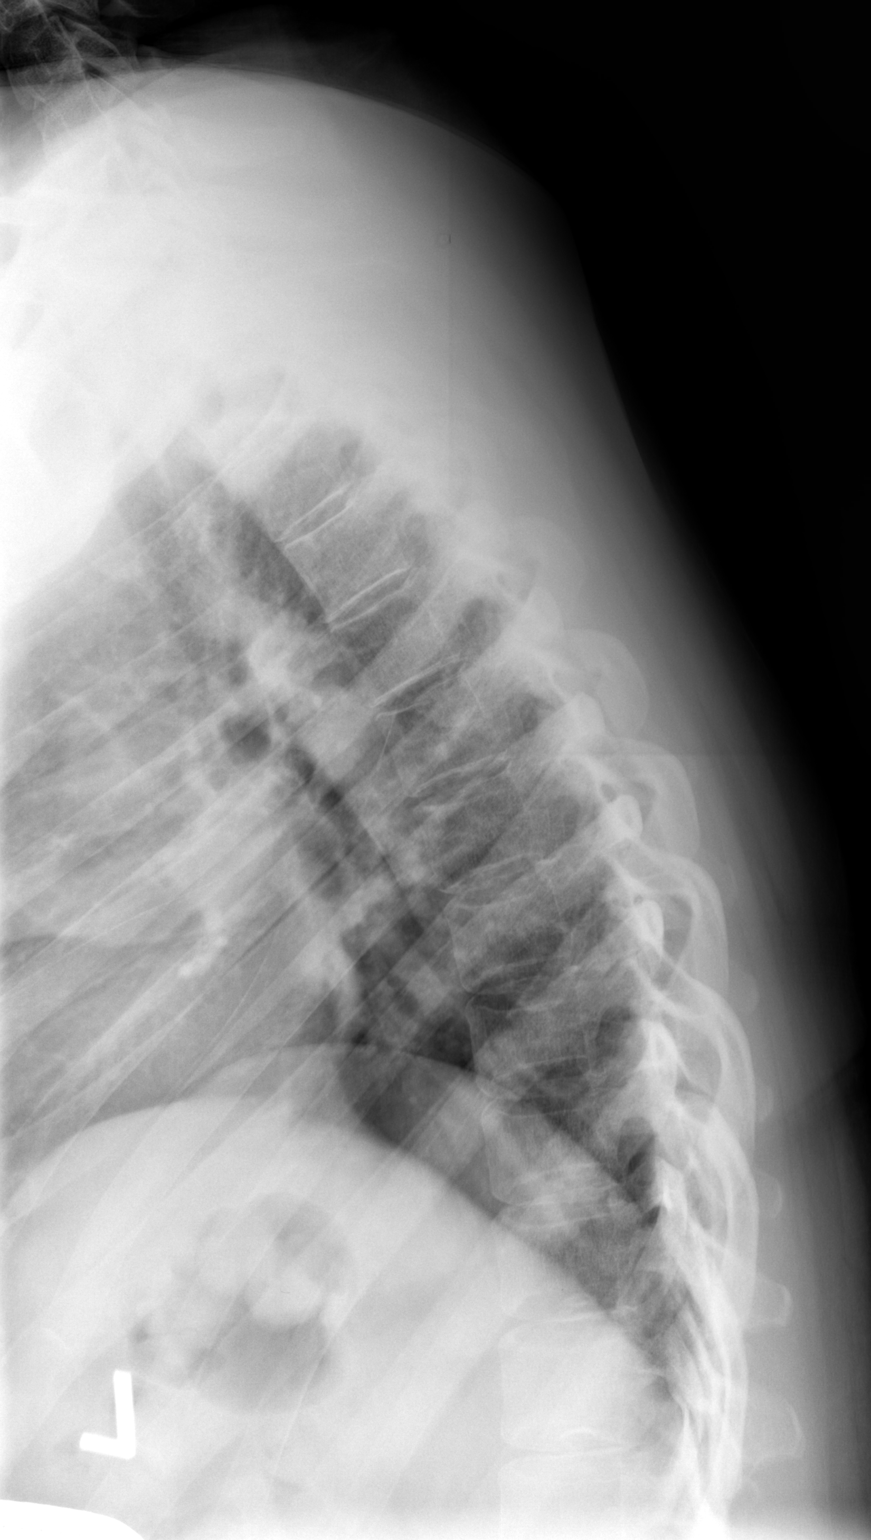

[t swimmers]
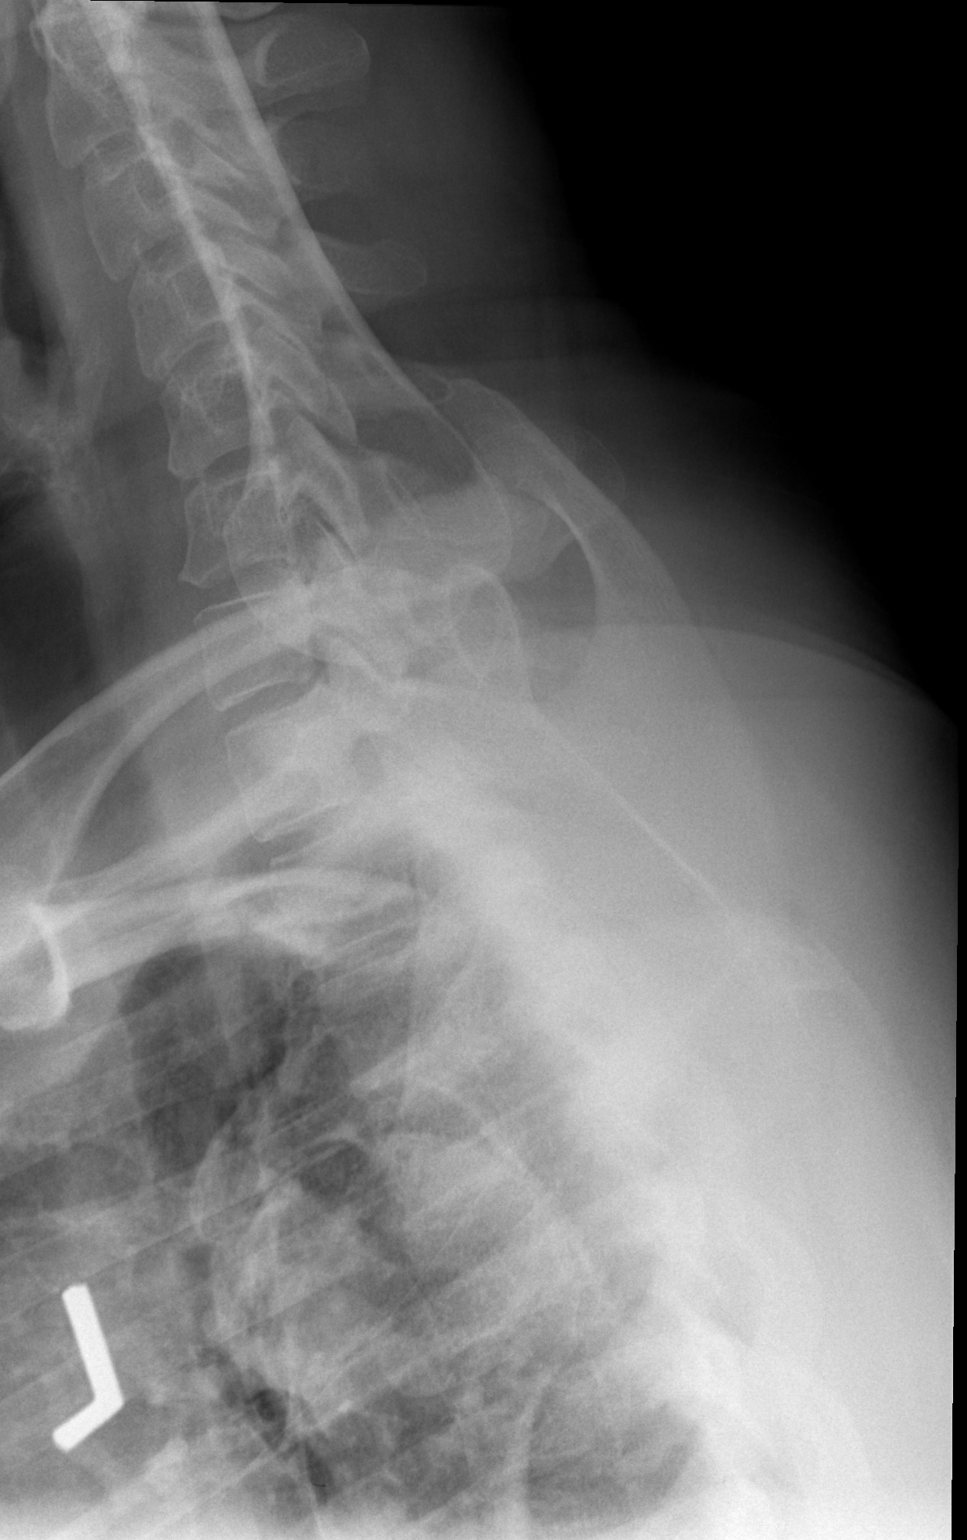

[3 of 3 positions shown; findings below may reference images not displayed]

FINDINGS: There is no evidence of thoracic spine fracture. Alignment is
normal. No other significant bone abnormalities are identified.
IMPRESSION: Negative.

## 2022-11-14 ENCOUNTER — Ambulatory Visit
Admission: RE | Admit: 2022-11-14 | Discharge: 2022-11-14 | Disposition: A | Discharge status: Home / Self Care | Payer: Medicaid Other | Source: Ambulatory Visit | Attending: Nurse Practitioner | Admitting: Nurse Practitioner

## 2022-11-14 VITALS — BP 135/79 | HR 92 | Temp 98.8°F | Resp 18

## 2022-11-14 DIAGNOSIS — H60391 Other infective otitis externa, right ear: Secondary | ICD-10-CM

## 2022-11-14 DIAGNOSIS — H66004 Acute suppurative otitis media without spontaneous rupture of ear drum, recurrent, right ear: Secondary | ICD-10-CM | POA: Diagnosis not present

## 2022-11-14 MED ORDER — CIPROFLOXACIN-DEXAMETHASONE 0.3-0.1 % OT SUSP: 4.0000 [drp] | Freq: Two times a day (BID) | OTIC | 0 refills | Status: AC

## 2022-11-14 MED ORDER — CIPROFLOXACIN-DEXAMETHASONE 0.3-0.1 % OT SUSP: 4.0000 [drp] | Freq: Two times a day (BID) | OTIC | 0 refills | Status: DC

## 2022-11-14 MED ORDER — AZITHROMYCIN 250 MG PO TABS: ORAL_TABLET | ORAL | 0 refills | Status: DC

## 2022-11-14 NOTE — ED Triage Notes (Signed)
Right ear pain.  States outside of ear hurts.  States inside of ear itches.  Pain x 3 to 4 days.  States she had this problem before and was give ciprodex that cleared it up.

## 2022-11-14 NOTE — Discharge Instructions (Addendum)
I believe you have an external ear infection and infection behind your ear drum.  Your ear drum appears to be intact.  Please start the oral azithromycin to treat the infection behind the ear drum and the ear drops to help clear up the infection.  Follow up with an ENT provider with persistent or worsening symptoms despite treatment.   Try to keep water out of your right ear until this fully clears up - you can use a vaseline-tipped cotton ball when you shower to prevent water from getting in your ear.

## 2022-11-14 NOTE — ED Provider Notes (Signed)
[facility]    CSN: [id number] Arrival date & time: 11/14/22  0947      History   Chief Complaint Chief Complaint  Patient presents with   Ear Fullness    Possible ear infection with pain and itch. - Entered by patient    HPI Jade Kelly is a 37 y.o. female.   Patient presents today with mom for a few day history of right ear pain.  Patient denies drainage from the ear or decreased hearing.  No recent fever, sore throat, or congestion.  Patient does endorse a slight cough for the past couple of days.  Mom reports ear was bothering her a couple of months ago, her primary care provider provided her with Polytrim drops which did not really help the ear.  Patient reports history of left external otitis, had to see a specialist, and was prescribed Ciprodex which helped cleared up.  They are requesting a refill of Ciprodex today.  Patient denies antibiotic use in the past 90 days.  Reports she takes doxycycline for acne vulgaris flares.  Reports history of allergic reaction to both sulfa and penicillin.  When she was 6, she had a rash on her chest from amoxicillin.  Mom does not think it was itchy or raised, but does not remember fully.  Does not know if she has taken cephalosporins in the past.  This also gave her COVID-19 symptoms reportedly.    Past Medical History:  Diagnosis Date   Anxiety    Asthma    prn inhaler   Breast mass, right 11/2013   Depression    Family history of anesthesia complication    maternal grandmother has hx. of post-op nausea   GERD (gastroesophageal reflux disease)     Patient Active Problem List   Diagnosis Date Noted   Acne vulgaris 09/05/2021   Allergic rhinitis 09/05/2021   Anxiety 09/05/2021   Exercise induced bronchospasm 09/05/2021   Gastroesophageal reflux disease 09/05/2021   Major depression single episode, in partial remission ([place]) 09/05/2021   Otitis externa 09/05/2021   Solitary cyst of breast 09/05/2021   Abnormal  auditory perception of right ear 08/07/2021   Dizziness 08/07/2021   Excessive cerumen in both ear canals 08/07/2021   Post-operative state 12/17/2013   Metatarsal fracture 06/30/2012    Past Surgical History:  Procedure Laterality Date   BREAST LUMPECTOMY WITH RADIOACTIVE SEED LOCALIZATION Right 11/26/2018   Procedure: RIGHT BREAST LUMPECTOMY WITH RADIOACTIVE SEED LOCALIZATION;  Surgeon: Erroll Luna, MD;  Location: [facility];  Service: General;  Laterality: Right;   EXCISION OF BREAST BIOPSY Right 12/02/2013   Procedure: RIGHT EXCISIONAL BREAST BIOPSY;  Surgeon: Joyice Faster. Cornett, MD;  Location: [place];  Service: General;  Laterality: Right;   WISDOM TOOTH EXTRACTION      OB History   No obstetric history on file.      Home Medications    Prior to Admission medications   Medication Sig Start Date End Date Taking? Authorizing Provider  azithromycin (ZITHROMAX) 250 MG tablet Take (2) tablets by mouth on day 1, then take (1) tablet by mouth on days 2-5. 11/14/22  Yes Eulogio Bear, NP  albuterol (VENTOLIN HFA) 108 (90 Base) MCG/ACT inhaler Inhale 2 puffs into the lungs every 6 (six) hours as needed for wheezing or shortness of breath.     [provider]  ALPRAZolam Duanne Moron) 0.25 MG tablet Take 0.25 mg by mouth daily as needed for anxiety.    [provider]  aspirin 81 MG chewable tablet Chew 81 mg by mouth once.    [provider]  buPROPion (WELLBUTRIN SR) 150 MG 12 hr tablet Take 150 mg by mouth daily.    [provider]  cetirizine (ZYRTEC ALLERGY) 10 MG tablet Take 1 tablet (10 mg total) by mouth daily. 10/22/20   Avegno, Darrelyn Hillock, FNP  ciprofloxacin-dexamethasone (CIPRODEX) OTIC suspension Place 4 drops into the right ear 2 (two) times daily for 7 days. 11/14/22 11/21/22  Eulogio Bear, NP  clindamycin (CLEOCIN T) 1 % external solution Apply 1 application topically daily. Applied to face 11/17/18   [provider]  FLUoxetine (PROZAC) 20 MG capsule Take 60 mg by mouth daily.    [provider]  fluticasone (FLONASE) 50 MCG/ACT nasal spray Place 1 spray into both nostrils daily for 14 days. 10/22/20 11/05/20  Avegno, Darrelyn Hillock, FNP  ibuprofen (ADVIL) 600 MG tablet Take 1 tablet (600 mg total) by mouth every 6 (six) hours as needed. 09/02/21   Melynda Ripple, MD  omeprazole (PRILOSEC) 20 MG capsule Take 1 capsule (20 mg total) by mouth daily. 02/14/20   Rancour, Annie Main, MD  ondansetron (ZOFRAN-ODT) 4 MG disintegrating tablet Take 1 tablet (4 mg total) by mouth every 8 (eight) hours as needed for nausea or vomiting. 02/14/20   Davonna Belling, MD    Family History Family History  Problem Relation Age of Onset   Anesthesia problems Maternal Grandmother        post-op nausea    Social History Social History   Tobacco Use   Smoking status: Never   Smokeless tobacco: Never  Vaping Use   Vaping Use: Never used  Substance Use Topics   Alcohol use: No   Drug use: No     Allergies   Penicillins and Sulfa antibiotics   Review of Systems Review of Systems Per HPI  Physical Exam Triage Vital Signs ED Triage Vitals  Enc Vitals Group     BP 11/14/22 0953 135/79     Pulse Rate 11/14/22 0953 92     Resp 11/14/22 0953 18     Temp 11/14/22 0953 98.8 F (37.1 C)     Temp Source 11/14/22 0953 Oral     SpO2 11/14/22 0953 96 %     Weight --      Height --      Head Circumference --      Peak Flow --      Pain Score 11/14/22 0954 2     Pain Loc --      Pain Edu? --      Excl. in [place]? --    No data found.  Updated Vital Signs BP 135/79 (BP Location: Right Arm)   Pulse 92   Temp 98.8 F (37.1 C) (Oral)   Resp 18   LMP 10/27/2022 (Exact Date)   SpO2 96%   Visual Acuity Right Eye Distance:   Left Eye Distance:   Bilateral Distance:    Right Eye Near:   Left Eye Near:    Bilateral Near:     Physical Exam Vitals and nursing note reviewed.   Constitutional:      General: She is not in acute distress.    Appearance: Normal appearance. She is not toxic-appearing.  HENT:     Head: Normocephalic and atraumatic.     Right Ear: Tympanic membrane, ear canal and external ear normal.     Left Ear: External ear normal. Drainage,  swelling and tenderness present. A middle ear effusion is present. Tympanic membrane is erythematous and bulging.     Nose: Nose normal. No congestion or rhinorrhea.     Mouth/Throat:     Mouth: Mucous membranes are moist.     Pharynx: Oropharynx is clear.  Eyes:     General: No scleral icterus.    Extraocular Movements: Extraocular movements intact.  Cardiovascular:     Rate and Rhythm: Normal rate.  Pulmonary:     Effort: Pulmonary effort is normal. No respiratory distress.  Musculoskeletal:     Cervical back: Normal range of motion.  Lymphadenopathy:     Cervical: No cervical adenopathy.  Skin:    General: Skin is warm and dry.     Coloration: Skin is not jaundiced or pale.     Findings: No erythema.  Neurological:     Mental Status: She is alert and oriented to person, place, and time.  Psychiatric:        Behavior: Behavior is cooperative.      UC Treatments / Results  Labs (all labs ordered are listed, but only abnormal results are displayed) Labs Reviewed - No data to display  EKG   Radiology No results found.  Procedures Procedures (including critical care time)  Medications Ordered in UC Medications - No data to display  Initial Impression / Assessment and Plan / UC Course  I have reviewed the triage vital signs and the nursing notes.  Pertinent labs & imaging results that were available during my care of the patient were reviewed by me and considered in my medical decision making (see chart for details).   Patient is well-appearing, normotensive, afebrile, not tachycardic, not tachypneic, oxygenating well on room air.   Infective otitis externa of right ear  2.  Recurrent acute suppurative otitis media of right ear without spontaneous rupture of tympanic membrane  Treat with topical Ciprodex, oral azithromycin Ear precautions discussed Return and ER precautions discussed Recommended close follow-up with ENT specialist with no improvement or worsening of symptoms despite treatment  The patient was given the opportunity to ask questions.  All questions answered to their satisfaction.  The patient is in agreement to this plan.     Final Clinical Impressions(s) / UC Diagnoses   Final diagnoses:  Infective otitis externa of right ear  Recurrent acute suppurative otitis media of right ear without spontaneous rupture of tympanic membrane     Discharge Instructions      I believe you have an external ear infection and infection behind your ear drum.  Your ear drum appears to be intact.  Please start the oral azithromycin to treat the infection behind the ear drum and the ear drops to help clear up the infection.  Follow up with an ENT provider with persistent or worsening symptoms despite treatment.   Try to keep water out of your right ear until this fully clears up - you can use a vaseline-tipped cotton ball when you shower to prevent water from getting in your ear.    ED Prescriptions     Medication Sig Dispense Auth. Provider   ciprofloxacin-dexamethasone (CIPRODEX) OTIC suspension Place 4 drops into the right ear 2 (two) times daily for 7 days. 7.5 mL Noemi Chapel A, NP   azithromycin (ZITHROMAX) 250 MG tablet Take (2) tablets by mouth on day 1, then take (1) tablet by mouth on days 2-5. 6 tablet Eulogio Bear, NP      PDMP not reviewed this encounter.  Eulogio Bear, NP 11/14/22 1013

## 2023-01-31 ENCOUNTER — Ambulatory Visit
Admission: RE | Admit: 2023-01-31 | Discharge: 2023-01-31 | Disposition: A | Discharge status: Home / Self Care | Payer: Medicaid Other | Source: Ambulatory Visit | Attending: Family Medicine

## 2023-01-31 VITALS — BP 100/64 | Temp 98.2°F | Resp 19

## 2023-01-31 DIAGNOSIS — H6993 Unspecified Eustachian tube disorder, bilateral: Secondary | ICD-10-CM | POA: Diagnosis not present

## 2023-01-31 DIAGNOSIS — R42 Dizziness and giddiness: Secondary | ICD-10-CM | POA: Diagnosis not present

## 2023-01-31 DIAGNOSIS — H6121 Impacted cerumen, right ear: Secondary | ICD-10-CM | POA: Diagnosis not present

## 2023-01-31 DIAGNOSIS — R079 Chest pain, unspecified: Secondary | ICD-10-CM

## 2023-01-31 LAB — POCT FASTING CBG KUC MANUAL ENTRY: POCT Glucose (KUC): 107 mg/dL — AB (ref 70–99)

## 2023-01-31 MED ORDER — FLUTICASONE PROPIONATE 50 MCG/ACT NA SUSP: 1.0000 | Freq: Two times a day (BID) | NASAL | 2 refills | Status: DC

## 2023-01-31 MED ORDER — CARBAMIDE PEROXIDE 6.5 % OT SOLN: 5.0000 [drp] | Freq: Two times a day (BID) | OTIC | 0 refills | Status: DC

## 2023-01-31 MED ORDER — CETIRIZINE HCL 10 MG PO TABS: 10.0000 mg | ORAL_TABLET | Freq: Every day | ORAL | 2 refills | Status: DC

## 2023-01-31 NOTE — ED Provider Notes (Signed)
[facility]    CSN: [id number] Arrival date & time: 01/31/23  1011      History   Chief Complaint Chief Complaint  Patient presents with   Headache    At random, I get dizzy and my head hurts in a stinging way along with a blurry feeling in my head and a sort of sound in my ear that makes me nauseous. - Entered by patient   Ear Pain   Dizziness    HPI Jade Kelly is a 37 y.o. female.   Presenting today with 4-day history of intermittent lightheadedness, ear pain and pressure and fullness, and states symptoms seem to be worse after she moves positions lasting only moments with longest period lasting up to 1 minute.  Had some left sided lateral chest pain here and there in the last couple of weeks but relates that to her acid reflux and not current symptoms.  Denies visual change, speech or swallowing issues, extremity weakness numbness or tingling, syncopal episodes.  States she has a long history of chronic ear issues that sometimes tend to cause the symptoms and thinks this is what is going on.  She tried to [facility] yesterday with minimal relief.    Past Medical History:  Diagnosis Date   Anxiety    Asthma    prn inhaler   Breast mass, right 11/2013   Depression    Family history of anesthesia complication    maternal grandmother has hx. of post-op nausea   GERD (gastroesophageal reflux disease)     Patient Active Problem List   Diagnosis Date Noted   Acne vulgaris 09/05/2021   Allergic rhinitis 09/05/2021   Anxiety 09/05/2021   Exercise induced bronchospasm 09/05/2021   Gastroesophageal reflux disease 09/05/2021   Major depression single episode, in partial remission ([place]) 09/05/2021   Otitis externa 09/05/2021   Solitary cyst of breast 09/05/2021   Abnormal auditory perception of right ear 08/07/2021   Dizziness 08/07/2021   Excessive cerumen in both ear canals 08/07/2021   Post-operative state 12/17/2013   Metatarsal fracture 06/30/2012     Past Surgical History:  Procedure Laterality Date   BREAST LUMPECTOMY WITH RADIOACTIVE SEED LOCALIZATION Right 11/26/2018   Procedure: RIGHT BREAST LUMPECTOMY WITH RADIOACTIVE SEED LOCALIZATION;  Surgeon: Erroll Luna, MD;  Location: [place];  Service: General;  Laterality: Right;   EXCISION OF BREAST BIOPSY Right 12/02/2013   Procedure: RIGHT EXCISIONAL BREAST BIOPSY;  Surgeon: Joyice Faster. Cornett, MD;  Location: [place];  Service: General;  Laterality: Right;   WISDOM TOOTH EXTRACTION      OB History   No obstetric history on file.      Home Medications    Prior to Admission medications   Medication Sig Start Date End Date Taking? Authorizing Provider  carbamide peroxide (DEBROX) 6.5 % OTIC solution Place 5 drops into both ears 2 (two) times daily. 01/31/23  Yes Volney American, PA-C  cetirizine (ZYRTEC ALLERGY) 10 MG tablet Take 1 tablet (10 mg total) by mouth daily. 01/31/23  Yes Volney American, PA-C  fluticasone [facility]) 50 MCG/ACT nasal spray Place 1 spray into both nostrils 2 (two) times daily. 01/31/23  Yes Volney American, PA-C  albuterol (VENTOLIN HFA) 108 (90 Base) MCG/ACT inhaler Inhale 2 puffs into the lungs every 6 (six) hours as needed for wheezing or shortness of breath.     [provider]  ALPRAZolam Duanne Moron) 0.25 MG tablet Take 0.25 mg by mouth daily as needed  for anxiety.    [provider]  aspirin 81 MG chewable tablet Chew 81 mg by mouth once.    [provider]  azithromycin (ZITHROMAX) 250 MG tablet Take (2) tablets by mouth on day 1, then take (1) tablet by mouth on days 2-5. 11/14/22   Eulogio Bear, NP  buPROPion [facility] SR) 150 MG 12 hr tablet Take 150 mg by mouth daily.    [provider]  cetirizine (ZYRTEC ALLERGY) 10 MG tablet Take 1 tablet (10 mg total) by mouth daily. 10/22/20   Avegno, Darrelyn Hillock, FNP  clindamycin (CLEOCIN T) 1 % external solution Apply 1 application  topically daily. Applied to face 11/17/18   [provider]  FLUoxetine (PROZAC) 20 MG capsule Take 60 mg by mouth daily.    [provider]  fluticasone (FLONASE) 50 MCG/ACT nasal spray Place 1 spray into both nostrils daily for 14 days. 10/22/20 11/05/20  Avegno, Darrelyn Hillock, FNP  ibuprofen (ADVIL) 600 MG tablet Take 1 tablet (600 mg total) by mouth every 6 (six) hours as needed. 09/02/21   Melynda Ripple, MD  omeprazole (PRILOSEC) 20 MG capsule Take 1 capsule (20 mg total) by mouth daily. 02/14/20   Rancour, Annie Main, MD  ondansetron (ZOFRAN-ODT) 4 MG disintegrating tablet Take 1 tablet (4 mg total) by mouth every 8 (eight) hours as needed for nausea or vomiting. 02/14/20   Davonna Belling, MD    Family History Family History  Problem Relation Age of Onset   Anesthesia problems Maternal Grandmother        post-op nausea    Social History Social History   Tobacco Use   Smoking status: Never   Smokeless tobacco: Never  Vaping Use   Vaping Use: Never used  Substance Use Topics   Alcohol use: No   Drug use: No     Allergies   Penicillins and Sulfa antibiotics   Review of Systems Review of Systems PER HPI  Physical Exam Triage Vital Signs ED Triage Vitals  Enc Vitals Group     BP 01/31/23 1053 100/64     Pulse --      Resp 01/31/23 1052 19     Temp 01/31/23 1052 98.2 F (36.8 C)     Temp Source 01/31/23 1052 Oral     SpO2 --      Weight --      Height --      Head Circumference --      Peak Flow --      Pain Score 01/31/23 1051 6     Pain Loc --      Pain Edu? --      Excl. in [place]? --    Orthostatic VS for the past 24 hrs:  BP- Lying Pulse- Lying BP- Sitting Pulse- Sitting BP- Standing at 0 minutes Pulse- Standing at 0 minutes  01/31/23 1131 112/69 82 111/75 89 119/64 96    Updated Vital Signs BP 100/64   Temp 98.2 F (36.8 C) (Oral)   Resp 19   Visual Acuity Right Eye Distance:   Left Eye Distance:   Bilateral Distance:    Right  Eye Near:   Left Eye Near:    Bilateral Near:     Physical Exam Vitals and nursing note reviewed.  Constitutional:      Appearance: Normal appearance. She is not ill-appearing.  HENT:     Head: Atraumatic.     Right Ear: There is impacted cerumen.  Ears:     Comments: B/l middle ear effusions    Nose: Nose normal.     Mouth/Throat:     Mouth: Mucous membranes are moist.  Eyes:     Extraocular Movements: Extraocular movements intact.     Conjunctiva/sclera: Conjunctivae normal.     Pupils: Pupils are equal, round, and reactive to light.  Cardiovascular:     Rate and Rhythm: Normal rate and regular rhythm.     Heart sounds: Normal heart sounds.  Pulmonary:     Effort: Pulmonary effort is normal.     Breath sounds: Normal breath sounds. No wheezing or rales.  Musculoskeletal:        General: Normal range of motion.     Cervical back: Normal range of motion and neck supple.  Skin:    General: Skin is warm and dry.  Neurological:     General: No focal deficit present.     Mental Status: She is alert and oriented to person, place, and time. Mental status is at baseline.     Cranial Nerves: No cranial nerve deficit.     Motor: No weakness.     Gait: Gait normal.  Psychiatric:        Mood and Affect: Mood normal.        Thought Content: Thought content normal.        Judgment: Judgment normal.      UC Treatments / Results  Labs (all labs ordered are listed, but only abnormal results are displayed) Labs Reviewed  POCT FASTING CBG [facility] MANUAL ENTRY - Abnormal; Notable for the following components:      Result Value   POCT Glucose ([facility]) 107 (*)    All other components within normal limits    EKG   Radiology No results found.  Procedures Procedures (including critical care time)  Medications Ordered in UC Medications - No data to display  Initial Impression / Assessment and Plan / UC Course  I have reviewed the triage vital signs and the nursing  notes.  Pertinent labs & imaging results that were available during my care of the patient were reviewed by me and considered in my medical decision making (see chart for details).     Vitals and exam very reassuring, point-of-care glucose with no significant abnormality, EKG showing sinus rhythm with occasional PACs at 81 bpm, orthostatic vital signs within normal limits.  Suspect symptoms are related to acute on chronic ear issues.  Lavage was performed to the right which was well-tolerated and cleared impaction, improved her hearing and fullness on the side.  Will also start Zyrtec, Flonase for middle ear effusions, decongestants as needed.  Follow-up with PCP for recheck.  Return for worsening symptoms.  Final Clinical Impressions(s) / UC Diagnoses   Final diagnoses:  Impacted cerumen of right ear  Dysfunction of both eustachian tubes  Lightheadedness  Chest pain, unspecified type   Discharge Instructions   None    ED Prescriptions     Medication Sig Dispense Auth. Provider   carbamide peroxide (DEBROX) 6.5 % OTIC solution Place 5 drops into both ears 2 (two) times daily. 15 mL Volney American, PA-C   fluticasone [facility]) 50 MCG/ACT nasal spray Place 1 spray into both nostrils 2 (two) times daily. 16 g Volney American, [place]   cetirizine (ZYRTEC ALLERGY) 10 MG tablet Take 1 tablet (10 mg total) by mouth daily. 30 tablet Volney American, [place]      PDMP not reviewed this encounter.  Volney American, [place] 01/31/23 1231

## 2023-01-31 NOTE — ED Triage Notes (Signed)
Pt presents with c/o HA and dizziness X 4 days.   States when she stands up she feels everything is blurry. States she does not feel she will pass out. Pt states she has had a lot of problems with her ears. Pt reports she has seen an ENT specialist a year ago.

## 2023-02-24 ENCOUNTER — Ambulatory Visit
Admission: RE | Admit: 2023-02-24 | Discharge: 2023-02-24 | Disposition: A | Discharge status: Home / Self Care | Payer: Medicaid Other | Source: Ambulatory Visit | Attending: Nurse Practitioner | Admitting: Nurse Practitioner

## 2023-02-24 VITALS — BP 117/66 | HR 71 | Temp 97.6°F | Resp 18

## 2023-02-24 DIAGNOSIS — H60502 Unspecified acute noninfective otitis externa, left ear: Secondary | ICD-10-CM

## 2023-02-24 MED ORDER — AZITHROMYCIN 250 MG PO TABS: ORAL_TABLET | ORAL | 0 refills | Status: DC

## 2023-02-24 MED ORDER — CIPROFLOXACIN-DEXAMETHASONE 0.3-0.1 % OT SUSP: 4.0000 [drp] | Freq: Two times a day (BID) | OTIC | 0 refills | Status: AC

## 2023-02-24 NOTE — ED Triage Notes (Signed)
Left ear pain and pressure that started 2 days ago also having pain on the outside of left ear to. Used debrox that she had left over from last time for past 2 days but feels like the ear pain is getting worse.

## 2023-02-24 NOTE — ED Provider Notes (Signed)
[facility]    CSN: [id number] Arrival date & time: 02/24/23  4098      History   Chief Complaint Chief Complaint  Patient presents with   Ear Fullness    Possible outer ear infection. Ear is too sore to touch and it aches more on the outside than the inside. - Entered by patient   Otalgia    HPI Jade Kelly is a 37 y.o. female.   Patient presents today with left-sided ear pain for the past few days.  Reports the pain is sharp and comes and goes.  Pain is worse with touch or movement of the ear.  Reports the ear is also been itchy "deep in it" and she endorses some hearing loss.  Does occasionally use Q-tips.  No recent fever, ear drainage, sore throat, cough, or congestion.  No recent water immersion.  Has tried using over-the-counter Debrox drops without much benefit.  She reports history of otitis externa as well as impacted cerumen.    Past Medical History:  Diagnosis Date   Anxiety    Asthma    prn inhaler   Breast mass, right 11/2013   Depression    Family history of anesthesia complication    maternal grandmother has hx. 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   Otitis externa 09/05/2021   Solitary cyst of breast 09/05/2021   Abnormal auditory perception of right ear 08/07/2021   Dizziness 08/07/2021   Excessive cerumen in both ear canals 08/07/2021   Post-operative state 12/17/2013   Metatarsal fracture 06/30/2012    Past Surgical History:  Procedure Laterality Date   BREAST LUMPECTOMY WITH RADIOACTIVE SEED LOCALIZATION Right 11/26/2018   Procedure: RIGHT BREAST LUMPECTOMY WITH RADIOACTIVE SEED LOCALIZATION;  Surgeon: Harriette Bouillon,  MD;  Location: [facility] OR;  Service: General;  Laterality: Right;   EXCISION OF BREAST BIOPSY Right 12/02/2013   Procedure: RIGHT EXCISIONAL BREAST BIOPSY;  Surgeon: Clovis Pu. Cornett, MD;  Location: [facility];  Service: General;  Laterality: Right;   WISDOM TOOTH EXTRACTION      OB History   No obstetric history on file.      Home Medications    Prior to Admission medications   Medication Sig Start Date End Date Taking? Authorizing Provider  buPROPion (WELLBUTRIN SR) 150 MG 12 hr tablet Take 150 mg by mouth daily.   Yes [provider]  carbamide peroxide (DEBROX) 6.5 % OTIC solution Place 5 drops into both ears 2 (two) times daily. 01/31/23  Yes Particia Nearing, PA-C  ciprofloxacin-dexamethasone [facility]) OTIC suspension Place 4 drops into the left ear 2 (two) times daily for 7 days. 02/24/23 03/03/23 Yes Cathlean Marseilles A, NP  clindamycin (CLEOCIN T) 1 % external solution Apply 1 application topically daily. Applied to face 11/17/18  Yes [provider]  FLUoxetine (PROZAC) 20 MG capsule Take 60 mg by mouth daily.   Yes [provider]  ibuprofen (ADVIL) 600 MG tablet Take 1 tablet (600 mg total) by mouth every 6 (six) hours as needed. 09/02/21  Yes Domenick Gong, MD  albuterol (VENTOLIN HFA) 108 (90 Base) MCG/ACT inhaler Inhale 2 puffs into the lungs every 6 (six) hours as  needed for wheezing or shortness of breath.     [provider]  ALPRAZolam Prudy Feeler) 0.25 MG tablet Take 0.25 mg by mouth daily as needed for anxiety.    [provider]  aspirin 81 MG chewable tablet Chew 81 mg by mouth once.    [provider]  azithromycin (ZITHROMAX) 250 MG tablet Take (2) tablets by mouth on day 1, then take (1) tablet by mouth on days 2-5. 02/24/23   Valentino Nose, NP  cetirizine (ZYRTEC ALLERGY) 10 MG tablet Take 1 tablet (10 mg total) by mouth daily. 10/22/20   Avegno, Zachery Dakins, FNP  cetirizine (ZYRTEC ALLERGY) 10  MG tablet Take 1 tablet (10 mg total) by mouth daily. 01/31/23   Particia Nearing, PA-C  fluticasone [facility]) 50 MCG/ACT nasal spray Place 1 spray into both nostrils daily for 14 days. 10/22/20 11/05/20  Avegno, Zachery Dakins, FNP  fluticasone (FLONASE) 50 MCG/ACT nasal spray Place 1 spray into both nostrils 2 (two) times daily. 01/31/23   Particia Nearing, PA-C  omeprazole (PRILOSEC) 20 MG capsule Take 1 capsule (20 mg total) by mouth daily. 02/14/20   Rancour, Jeannett Senior, MD  ondansetron (ZOFRAN-ODT) 4 MG disintegrating tablet Take 1 tablet (4 mg total) by mouth every 8 (eight) hours as needed for nausea or vomiting. 02/14/20   Benjiman Core, MD    Family History Family History  Problem Relation Age of Onset   Anesthesia problems Maternal Grandmother        post-op nausea    Social History Social History   Tobacco Use   Smoking status: Never   Smokeless tobacco: Never  Vaping Use   Vaping Use: Never used  Substance Use Topics   Alcohol use: No   Drug use: No     Allergies   Penicillins and Sulfa antibiotics   Review of Systems Review of Systems Per HPI  Physical Exam Triage Vital Signs ED Triage Vitals [02/24/23 0858]  Enc Vitals Group     BP 117/66     Pulse Rate 71     Resp 18     Temp 97.6 F (36.4 C)     Temp Source Oral     SpO2 99 %     Weight      Height      Head Circumference      Peak Flow      Pain Score 9     Pain Loc      Pain Edu?      Excl. in GC?    No data found.  Updated Vital Signs BP 117/66 (BP Location: Right Arm)   Pulse 71   Temp 97.6 F (36.4 C) (Oral)   Resp 18   LMP 02/20/2023 (Exact Date)   SpO2 99%   Visual Acuity Right Eye Distance:   Left Eye Distance:   Bilateral Distance:    Right Eye Near:   Left Eye Near:    Bilateral Near:     Physical Exam Vitals and nursing note reviewed.  Constitutional:      General: She is not in acute distress.    Appearance: Normal appearance. She is not toxic-appearing.   HENT:     Head: Normocephalic and atraumatic.     Right Ear: Tympanic membrane, ear canal and external ear normal. There is no impacted cerumen. No mastoid tenderness.     Left Ear: Decreased hearing noted. Swelling and tenderness present. A middle ear effusion is present. No mastoid tenderness. Tympanic  membrane is erythematous.     Nose: Nose normal. No congestion or rhinorrhea.     Mouth/Throat:     Mouth: Mucous membranes are moist.     Pharynx: Oropharynx is clear. No oropharyngeal exudate or posterior oropharyngeal erythema.  Eyes:     General: No scleral icterus.    Extraocular Movements: Extraocular movements intact.  Pulmonary:     Effort: Pulmonary effort is normal. No respiratory distress.  Musculoskeletal:     Cervical back: Normal range of motion.  Lymphadenopathy:     Cervical: No cervical adenopathy.  Skin:    General: Skin is warm and dry.     Coloration: Skin is not jaundiced or pale.     Findings: No erythema.  Neurological:     Mental Status: She is alert and oriented to person, place, and time.     Motor: No weakness.  Psychiatric:        Behavior: Behavior is cooperative.      UC Treatments / Results  Labs (all labs ordered are listed, but only abnormal results are displayed) Labs Reviewed - No data to display  EKG   Radiology No results found.  Procedures Procedures (including critical care time)  Medications Ordered in UC Medications - No data to display  Initial Impression / Assessment and Plan / UC Course  I have reviewed the triage vital signs and the nursing notes.  Pertinent labs & imaging results that were available during my care of the patient were reviewed by me and considered in my medical decision making (see chart for details).   Patient is well-appearing, normotensive, afebrile, not tachycardic, not tachypneic, oxygenating well on room air.    1. Acute otitis externa of left ear, unspecified type Patient appears to have  acute middle ear and outer ear infections Treat with topical Ciprodex drops twice daily for 7 days Start oral azithromycin to treat for middle ear infection-patient has a history of allergy to penicillins Ear precautions discussed with patient Return for persistent or worsening symptoms despite treatment  The patient was given the opportunity to ask questions.  All questions answered to their satisfaction.  The patient is in agreement to this plan.    Final Clinical Impressions(s) / UC Diagnoses   Final diagnoses:  Acute otitis externa of left ear, unspecified type     Discharge Instructions      You have an ear infection in your left ear.  Please take the oral antibiotics as well as the ear drops to help treat it.   Please protect your ear from getting any water in it while you are on treatment.  Refrain from using Q-tips.     ED Prescriptions     Medication Sig Dispense Auth. Provider   azithromycin (ZITHROMAX) 250 MG tablet Take (2) tablets by mouth on day 1, then take (1) tablet by mouth on days 2-5. 6 tablet Cathlean Marseilles A, NP   ciprofloxacin-dexamethasone (CIPRODEX) OTIC suspension Place 4 drops into the left ear 2 (two) times daily for 7 days. 7.5 mL Valentino Nose, NP      PDMP not reviewed this encounter.   Valentino Nose, NP 02/24/23 [phone]

## 2023-02-24 NOTE — Discharge Instructions (Signed)
You have an ear infection in your left ear.  Please take the oral antibiotics as well as the ear drops to help treat it.   Please protect your ear from getting any water in it while you are on treatment.  Refrain from using Q-tips.

## 2023-02-24 NOTE — ED Notes (Signed)
Asked patient about suicide assessment and patient did say she had a thought of harming herself this past month but is not having any more thoughts now. It was a one time though and no plan or actions to harm herself. Asked her about if she needed resources or has a plan to help her when she does have these thoughts. She stated she talks with her friends and family when she has those thoughts and it really helps. Does have family support and resources if she needs them. Told her that was great she has such a good support system and told provider. Will gave some resources for help line and BH facility if she feels like she needs them in the future.

## 2023-04-04 ENCOUNTER — Emergency Department (HOSPITAL_COMMUNITY)
Admission: EM | Admit: 2023-04-04 | Discharge: 2023-04-04 | Disposition: A | Discharge status: Home / Self Care | Payer: Medicaid Other | Attending: Emergency Medicine | Admitting: Emergency Medicine

## 2023-04-04 ENCOUNTER — Encounter (HOSPITAL_COMMUNITY): Payer: Self-pay | Admitting: Emergency Medicine

## 2023-04-04 ENCOUNTER — Emergency Department (HOSPITAL_COMMUNITY): Payer: Medicaid Other

## 2023-04-04 ENCOUNTER — Other Ambulatory Visit: Payer: Self-pay

## 2023-04-04 DIAGNOSIS — R0789 Other chest pain: Secondary | ICD-10-CM | POA: Diagnosis not present

## 2023-04-04 DIAGNOSIS — R072 Precordial pain: Secondary | ICD-10-CM | POA: Diagnosis present

## 2023-04-04 DIAGNOSIS — J45909 Unspecified asthma, uncomplicated: Secondary | ICD-10-CM | POA: Diagnosis not present

## 2023-04-04 DIAGNOSIS — R110 Nausea: Secondary | ICD-10-CM | POA: Diagnosis not present

## 2023-04-04 DIAGNOSIS — Z7982 Long term (current) use of aspirin: Secondary | ICD-10-CM | POA: Diagnosis not present

## 2023-04-04 LAB — CBC
HCT: 40.3 % (ref 36.0–46.0)
Hemoglobin: 13.6 g/dL (ref 12.0–15.0)
MCH: 28 pg (ref 26.0–34.0)
MCHC: 33.7 g/dL (ref 30.0–36.0)
MCV: 83.1 fL (ref 80.0–100.0)
Platelets: 304 10*3/uL (ref 150–400)
RBC: 4.85 MIL/uL (ref 3.87–5.11)
RDW: 12.3 % (ref 11.5–15.5)
WBC: 11 10*3/uL — ABNORMAL HIGH (ref 4.0–10.5)
nRBC: 0 % (ref 0.0–0.2)

## 2023-04-04 LAB — BASIC METABOLIC PANEL
Anion gap: 11 (ref 5–15)
BUN: 21 mg/dL — ABNORMAL HIGH (ref 6–20)
CO2: 23 mmol/L (ref 22–32)
Calcium: 9 mg/dL (ref 8.9–10.3)
Chloride: 100 mmol/L (ref 98–111)
Creatinine, Ser: 0.73 mg/dL (ref 0.44–1.00)
GFR, Estimated: >60 mL/min (ref >60)
Glucose, Bld: 99 mg/dL (ref 70–99)
Potassium: 3.8 mmol/L (ref 3.5–5.1)
Sodium: 134 mmol/L — ABNORMAL LOW (ref 135–145)

## 2023-04-04 LAB — TROPONIN I (HIGH SENSITIVITY)
Troponin I (High Sensitivity): <2 ng/L (ref <18)
Troponin I (High Sensitivity): <2 ng/L (ref <18)

## 2023-04-04 NOTE — Discharge Instructions (Signed)
Workup here negative for any acute cardiac event.  Can follow-up with cardiology for additional workup or back with primary care doctor.  Return for any new or worse symptoms.

## 2023-04-04 NOTE — ED Triage Notes (Signed)
Pt reports mid chest pain with radiation to corresponding point on back and to left shoulder and arm. Pt feels dizzy and was briefly nauseated. Denies SOB and diaphoresis. Pain rated 7/10 intermittent. No cardiac hx

## 2023-04-04 NOTE — ED Provider Notes (Addendum)
[facility] EM[facility]MENT AT [facility] Provider Note   CSN: [id number] Arrival date & time: 04/04/23  1420     History  Chief Complaint  Patient presents with   Chest Pain    Jade Kelly is a 37 y.o. female.  Patient with onset of left substernal chest pain with some radiation to the shoulder left upper arm and to the back.  Started at 1400 today.  Some slight nausea no shortness of breath.  Patient just has some discomfort in the shoulder little bit in the back area at this time.  But has not been pain-free.  Patient is never had anything like this before.  Past medical history significant for depression anxiety asthma right breast mass history of gastroesophageal reflux disease.  Patient is never used tobacco products.       Home Medications Prior to Admission medications   Medication Sig Start Date End Date Taking? Authorizing Provider  albuterol (VENTOLIN HFA) 108 (90 Base) MCG/ACT inhaler Inhale 2 puffs into the lungs every 6 (six) hours as needed for wheezing or shortness of breath.     [provider]  ALPRAZolam Prudy Feeler) 0.25 MG tablet Take 0.25 mg by mouth daily as needed for anxiety.    [provider]  aspirin 81 MG chewable tablet Chew 81 mg by mouth once.    [provider]  azithromycin (ZITHROMAX) 250 MG tablet Take (2) tablets by mouth on day 1, then take (1) tablet by mouth on days 2-5. 02/24/23   Valentino Nose, NP  buPROPion [facility] SR) 150 MG 12 hr tablet Take 150 mg by mouth daily.    [provider]  carbamide peroxide (DEBROX) 6.5 % OTIC solution Place 5 drops into both ears 2 (two) times daily. 01/31/23   Particia Nearing, PA-C  cetirizine (ZYRTEC ALLERGY) 10 MG tablet Take 1 tablet (10 mg total) by mouth daily. 10/22/20   Avegno, Zachery Dakins, FNP  cetirizine (ZYRTEC ALLERGY) 10 MG tablet Take 1 tablet (10 mg total) by mouth daily. 01/31/23   Particia Nearing, PA-C  clindamycin (CLEOCIN  T) 1 % external solution Apply 1 application topically daily. Applied to face 11/17/18   [provider]  FLUoxetine (PROZAC) 20 MG capsule Take 60 mg by mouth daily.    [provider]  fluticasone (FLONASE) 50 MCG/ACT nasal spray Place 1 spray into both nostrils daily for 14 days. 10/22/20 11/05/20  Avegno, Zachery Dakins, FNP  fluticasone (FLONASE) 50 MCG/ACT nasal spray Place 1 spray into both nostrils 2 (two) times daily. 01/31/23   Particia Nearing, PA-C  ibuprofen (ADVIL) 600 MG tablet Take 1 tablet (600 mg total) by mouth every 6 (six) hours as needed. 09/02/21   Domenick Gong, MD  omeprazole (PRILOSEC) 20 MG capsule Take 1 capsule (20 mg total) by mouth daily. 02/14/20   Rancour, Jeannett Senior, MD  ondansetron (ZOFRAN-ODT) 4 MG disintegrating tablet Take 1 tablet (4 mg total) by mouth every 8 (eight) hours as needed for nausea or vomiting. 02/14/20   Benjiman Core, MD      Allergies    Penicillins and Sulfa antibiotics    Review of Systems   Review of Systems  Constitutional:  Negative for chills and fever.  HENT:  Negative for ear pain and sore throat.   Eyes:  Negative for pain and visual disturbance.  Respiratory:  Negative for cough and shortness of breath.   Cardiovascular:  Positive for chest pain. Negative for palpitations.  Gastrointestinal:  Positive for nausea. Negative for abdominal pain and vomiting.  Genitourinary:  Negative for dysuria and hematuria.  Musculoskeletal:  Positive for back pain. Negative for arthralgias.  Skin:  Negative for color change and rash.  Neurological:  Negative for seizures and syncope.  All other systems reviewed and are negative.   Physical Exam Updated Vital Signs BP (!) 128/113 (BP Location: Left Arm)   Pulse 93   Temp 99.1 F (37.3 C) (Oral)   Resp 16   Ht 1.702 m (5\' 7" )   Wt 94.3 kg   LMP 03/25/2023 (Approximate)   SpO2 100%   BMI 32.58 kg/m  Physical Exam Vitals and nursing note reviewed.   Constitutional:      General: She is not in acute distress.    Appearance: Normal appearance. She is well-developed.  HENT:     Head: Normocephalic and atraumatic.  Eyes:     Extraocular Movements: Extraocular movements intact.     Conjunctiva/sclera: Conjunctivae normal.     Pupils: Pupils are equal, round, and reactive to light.  Cardiovascular:     Rate and Rhythm: Normal rate and regular rhythm.     Heart sounds: No murmur heard. Pulmonary:     Effort: Pulmonary effort is normal. No respiratory distress.     Breath sounds: Normal breath sounds.  Abdominal:     Palpations: Abdomen is soft.     Tenderness: There is no abdominal tenderness.  Musculoskeletal:        General: No swelling.     Cervical back: Normal range of motion and neck supple.  Skin:    General: Skin is warm and dry.     Capillary Refill: Capillary refill takes less than 2 seconds.  Neurological:     General: No focal deficit present.     Mental Status: She is alert and oriented to person, place, and time.  Psychiatric:        Mood and Affect: Mood normal.     ED Results / Procedures / Treatments   Labs (all labs ordered are listed, but only abnormal results are displayed) Labs Reviewed  BASIC METABOLIC PANEL - Abnormal; Notable for the following components:      Result Value   Sodium 134 (*)    BUN 21 (*)    All other components within normal limits  CBC  POC URINE PREG, ED  TROPONIN I (HIGH SENSITIVITY)  TROPONIN I (HIGH SENSITIVITY)    EKG EKG Interpretation  Date/Time:  Friday Apr 04 2023 14:52:47 EDT Ventricular Rate:  92 PR Interval:  152 QRS Duration: 84 QT Interval:  362 QTC Calculation: 447 R Axis:   43 Text Interpretation: Normal sinus rhythm Normal ECG When compared with ECG of 31-Jan-2023 11:28, No significant change was found Confirmed by Vanetta Mulders [id number]) on 04/04/2023 4:30:49 PM  Radiology DG Chest 2 View  Result Date: 04/04/2023 CLINICAL DATA:  Chest pain EXAM:  CHEST - 2 VIEW COMPARISON:  X-ray 02/22/2020 and older FINDINGS: No consolidation, pneumothorax or effusion. No edema. Normal cardiopericardial silhouette. Overlapping cardiac leads. IMPRESSION: No acute cardiopulmonary disease Electronically Signed   By: Karen Kays M.D.   On: 04/04/2023 15:37    Procedures Procedures    Medications Ordered in ED Medications - No data to display  ED Course/ Medical Decision Making/ A&P                             Medical Decision  Making Amount and/or Complexity of Data Reviewed Labs: ordered. Radiology: ordered.   Patient was some chest pain radiation to the back and left shoulder left arm.  Patient almost pain-free currently.  Basic metabolic panel normal except for sodium of 134 kidney function is normal.  Chest x-ray without any acute findings.  EKG without any acute findings.  CBC pending and delta troponin pending.  Based on patient's age unlikely to be acute cardiac event but delta troponin is required   Delta troponins are both less than 2.  CBC white count 11 hemoglobin 40 platelets are normal basic metabolic panel normal renal function normal sodium was a little low at 134.  Chest x-ray negative EKG without acute changes.  Patient stable for discharge home follow-up outpatient cardiology and back to her primary care doctor.  No evidence of any acute cardiac event.  Oxygen saturations are 100% not worried about pulmonary embolus.  Final Clinical Impression(s) / ED Diagnoses Final diagnoses:  Atypical chest pain    Rx / DC Orders ED Discharge Orders     None         Vanetta Mulders, MD 04/04/23 1717    Vanetta Mulders, MD 04/04/23 1859

## 2023-04-04 NOTE — ED Notes (Signed)
CBC clotted and has been ordered as a re-draw. Due to anxiety and "fear of needles" the CBC redraw will be done with the 2nd trop draw. IV will not pull back.... ie the initial clotting. MD aware

## 2023-05-13 ENCOUNTER — Ambulatory Visit: Payer: Medicaid Other | Attending: Internal Medicine | Admitting: Internal Medicine

## 2023-05-13 ENCOUNTER — Encounter: Payer: Self-pay | Admitting: Internal Medicine

## 2023-05-13 ENCOUNTER — Encounter: Payer: Self-pay | Admitting: *Deleted

## 2023-05-13 ENCOUNTER — Other Ambulatory Visit (HOSPITAL_COMMUNITY)
Admission: RE | Admit: 2023-05-13 | Discharge: 2023-05-13 | Disposition: A | Discharge status: Home / Self Care | Payer: Medicaid Other | Source: Ambulatory Visit | Attending: Internal Medicine | Admitting: Internal Medicine

## 2023-05-13 VITALS — BP 100/70 | HR 111 | Ht 67.0 in | Wt 212.0 lb

## 2023-05-13 DIAGNOSIS — R079 Chest pain, unspecified: Secondary | ICD-10-CM | POA: Insufficient documentation

## 2023-05-13 DIAGNOSIS — Z8249 Family history of ischemic heart disease and other diseases of the circulatory system: Secondary | ICD-10-CM | POA: Insufficient documentation

## 2023-05-13 DIAGNOSIS — R000 Tachycardia, unspecified: Secondary | ICD-10-CM | POA: Insufficient documentation

## 2023-05-13 LAB — TSH: TSH: 1.71 u[IU]/mL (ref 0.350–4.500)

## 2023-05-13 NOTE — Patient Instructions (Addendum)
Medication Instructions:  Your physician recommends that you continue on your current medications as directed. Please refer to the Current Medication list given to you today.   Labwork: TSH to be done today - order given Jeani Hawking)   Testing/Procedures: Your physician has requested that you have an echocardiogram. Echocardiography is a painless test that uses sound waves to create images of your heart. It provides your doctor with information about the size and shape of your heart and how well your heart's chambers and valves are working. This procedure takes approximately one hour. There are no restrictions for this procedure. Please do NOT wear cologne, perfume, aftershave, or lotions (deodorant is allowed). Please arrive 15 minutes prior to your appointment time.  Your physician has requested that you have an exercise tolerance test. For further information please visit [email or web address] Please also follow instruction sheet, as given.   Follow-Up:  Office will contact with results via phone, letter or mychart.    Your physician recommends that you schedule a follow-up appointment in: Pending Results  Any Other Special Instructions Will Be Listed Below (If Applicable).  If you need a refill on your cardiac medications before your next appointment, please call your pharmacy.

## 2023-05-13 NOTE — Progress Notes (Signed)
Cardiology Office Note  Date: 05/13/2023   ID: Jade Kelly, DOB Sep 02, 1986, MRN [id number]  PCP:  Marjo Bicker, MD  Cardiologist:  None Electrophysiologist:  None   Reason for Office Visit: Post ER visit for chest pain   History of Present Illness: Jade Kelly is a 37 y.o. female known to have anxiety, asthma was referred to cardiology clinic for evaluation of chest pain.  Patient had ER visit on 04/04/2023 for chest pain. EKG showed NSR and no evidence of ischemia.  Troponins were within normal limits.  Patient reports that she gets chest pain only when she gets anxious.  No chest pain with exertion.  She is extremely anxious and is trying to find a good therapist.  No DOE, dizziness, lightheadedness, syncope, palpitations.  Patient's father passed away in 2019/05/30 and has a history of congestive heart failure.  Her mother was diagnosed with congestive heart failure in her 11s.   Past Medical History:  Diagnosis Date   Anxiety    Asthma    prn inhaler   Breast mass, right 11/2013   Depression    Family history of anesthesia complication    maternal grandmother has hx. of post-op nausea   GERD (gastroesophageal reflux disease)     Past Surgical History:  Procedure Laterality Date   BREAST LUMPECTOMY WITH RADIOACTIVE SEED LOCALIZATION Right 11/26/2018   Procedure: RIGHT BREAST LUMPECTOMY WITH RADIOACTIVE SEED LOCALIZATION;  Surgeon: Harriette Bouillon, MD;  Location: [facility];  Service: General;  Laterality: Right;   EXCISION OF BREAST BIOPSY Right 12/02/2013   Procedure: RIGHT EXCISIONAL BREAST BIOPSY;  Surgeon: Clovis Pu. Cornett, MD;  Location: [facility];  Service: General;  Laterality: Right;   WISDOM TOOTH EXTRACTION      Current Outpatient Medications  Medication Sig Dispense Refill   albuterol (VENTOLIN HFA) 108 (90 Base) MCG/ACT inhaler Inhale 2 puffs into the lungs every 6 (six) hours as needed for wheezing or shortness of breath.       ALPRAZolam (XANAX) 0.25 MG tablet Take 0.25 mg by mouth daily as needed for anxiety.     aspirin 81 MG chewable tablet Chew 81 mg by mouth as needed.     buPROPion (WELLBUTRIN SR) 150 MG 12 hr tablet Take 150 mg by mouth daily.     clindamycin (CLEOCIN T) 1 % external solution Apply 1 application topically daily. Applied to face     doxycycline (VIBRAMYCIN) 100 MG capsule Take 100 mg by mouth as needed (face breakouts).     FLUoxetine (PROZAC) 20 MG capsule Take 60 mg by mouth daily.     ibuprofen (ADVIL) 600 MG tablet Take 1 tablet (600 mg total) by mouth every 6 (six) hours as needed. 30 tablet 0   loratadine (CLARITIN) 10 MG tablet Take 10 mg by mouth as needed for allergies.     ondansetron (ZOFRAN-ODT) 4 MG disintegrating tablet Take 1 tablet (4 mg total) by mouth every 8 (eight) hours as needed for nausea or vomiting. 8 tablet 0   No current facility-administered medications for this visit.   Allergies:  Penicillins and Sulfa antibiotics   Social History: The patient  reports that she has never smoked. She has never used smokeless tobacco. She reports that she does not drink alcohol and does not use drugs.   Family History: The patient's family history includes Anesthesia problems in her maternal grandmother.   ROS:  Please see the history of present illness. Otherwise, complete review of  systems is positive for none  All other systems are reviewed and negative.   Physical Exam: VS:  BP 100/70   Pulse (!) 111   Ht 5\' 7"  (1.702 m)   Wt 212 lb (96.2 kg)   SpO2 97%   BMI 33.20 kg/m , BMI Body mass index is 33.2 kg/m.  Wt Readings from Last 3 Encounters:  05/13/23 212 lb (96.2 kg)  04/04/23 208 lb (94.3 kg)  09/05/21 205 lb (93 kg)    General: Patient appears comfortable at rest. HEENT: Conjunctiva and lids normal, oropharynx clear with moist mucosa. Neck: Supple, no elevated JVP or carotid bruits, no thyromegaly. Lungs: Clear to auscultation, nonlabored breathing at  rest. Cardiac: Regular rate and rhythm, no S3 or significant systolic murmur, no pericardial rub. Abdomen: Soft, nontender, no hepatomegaly, bowel sounds present, no guarding or rebound. Extremities: No pitting edema, distal pulses 2+. Skin: Warm and dry. Musculoskeletal: No kyphosis. Neuropsychiatric: Alert and oriented x3, affect grossly appropriate.  Recent Labwork: 04/04/2023: BUN 21; Creatinine, Ser 0.73; Hemoglobin 13.6; Platelets 304; Potassium 3.8; Sodium 134  No results found for: "CHOL", "TRIG", "HDL", "CHOLHDL", "VLDL", "LDLCALC", "LDLDIRECT"   Assessment and Plan:  # Chest pain likely secondary to anxiety -Patient has been anxious throughout the interview. HR 111.  Chest pain is likely secondary to anxiety. Will obtain exercise tolerance test to rule out any cardiac causes of chest pain.  Anxiety reduction techniques discussed which include therapy counseling, yoga and meditation.  Patient is resting HR has been more than 90 bpm which the patient reported is normal.  Obtain TSH.  # Family history of congestive heart failure (in the mother) -Obtain 2D echocardiogram  I have spent a total of 45 minutes with patient reviewing chart, EKGs, labs and examining patient as well as establishing an assessment and plan that was discussed with the patient.  > 50% of time was spent in direct patient care.    Medication Adjustments/Labs and Tests Ordered: Current medicines are reviewed at length with the patient today.  Concerns regarding medicines are outlined above.   Tests Ordered: Orders Placed This Encounter  Procedures   TSH   EXERCISE TOLERANCE TEST (ETT)   ECHOCARDIOGRAM COMPLETE    Medication Changes: No orders of the defined types were placed in this encounter.   Disposition:  Follow up  pending results  Signed Marika Mahaffy Verne Spurr, MD, 05/13/2023 2:20 PM    [facility] at [facility] [address] [place], [place], [place] [address]

## 2023-05-16 ENCOUNTER — Telehealth: Payer: Self-pay

## 2023-05-16 ENCOUNTER — Ambulatory Visit (HOSPITAL_COMMUNITY)
Admission: RE | Admit: 2023-05-16 | Discharge: 2023-05-16 | Disposition: A | Discharge status: Home / Self Care | Payer: Medicaid Other | Source: Ambulatory Visit | Attending: Internal Medicine | Admitting: Internal Medicine

## 2023-05-16 DIAGNOSIS — R079 Chest pain, unspecified: Secondary | ICD-10-CM | POA: Insufficient documentation

## 2023-05-16 LAB — EXERCISE TOLERANCE TEST
Angina Index: 0
Duke Treadmill Score: 7
Estimated workload: 8.5
Exercise duration (min): 6 min
Exercise duration (sec): 52 s
MPHR: 185 {beats}/min
Peak HR: 150 {beats}/min
Percent HR: 81 %
RPE: 19
Rest HR: 97 {beats}/min
ST Depression (mm): 0 mm

## 2023-05-16 LAB — GLUCOSE, CAPILLARY: Glucose-Capillary: 96 mg/dL (ref 70–99)

## 2023-05-16 NOTE — Telephone Encounter (Signed)
-----   Message from Vishnu P Mallipeddi sent at 05/16/2023  2:44 PM EDT ----- Normal TSH.

## 2023-05-16 NOTE — Telephone Encounter (Signed)
Called patient left message and that MyChart message was sent with results and copy sent to PCP

## 2023-05-20 ENCOUNTER — Ambulatory Visit: Payer: Medicaid Other | Attending: Internal Medicine

## 2023-05-20 DIAGNOSIS — R079 Chest pain, unspecified: Secondary | ICD-10-CM | POA: Diagnosis not present

## 2023-05-20 DIAGNOSIS — Z8249 Family history of ischemic heart disease and other diseases of the circulatory system: Secondary | ICD-10-CM

## 2023-05-21 LAB — ECHOCARDIOGRAM COMPLETE
AR max vel: 3.57 cm2
AV Area VTI: 3.11 cm2
AV Area mean vel: 3.14 cm2
AV Mean grad: 4 mmHg
AV Peak grad: 6.2 mmHg
Ao pk vel: 1.24 m/s
Area-P 1/2: 4.08 cm2
Calc EF: 67.5 %
MV VTI: 4.22 cm2
S' Lateral: 2.9 cm
Single Plane A2C EF: 68.2 %
Single Plane A4C EF: 65.7 %

## 2023-05-27 ENCOUNTER — Ambulatory Visit
Admission: RE | Admit: 2023-05-27 | Discharge: 2023-05-27 | Disposition: A | Discharge status: Home / Self Care | Payer: Medicaid Other | Source: Ambulatory Visit | Attending: Nurse Practitioner | Admitting: Nurse Practitioner

## 2023-05-27 ENCOUNTER — Telehealth: Payer: Self-pay | Admitting: Internal Medicine

## 2023-05-27 ENCOUNTER — Telehealth: Payer: Self-pay | Admitting: Emergency Medicine

## 2023-05-27 VITALS — BP 108/64 | HR 91 | Temp 98.2°F | Resp 15

## 2023-05-27 DIAGNOSIS — R519 Headache, unspecified: Secondary | ICD-10-CM

## 2023-05-27 DIAGNOSIS — M542 Cervicalgia: Secondary | ICD-10-CM

## 2023-05-27 MED ORDER — TIZANIDINE HCL 4 MG PO TABS: 4.0000 mg | ORAL_TABLET | Freq: Three times a day (TID) | ORAL | 0 refills | Status: AC | PRN

## 2023-05-27 NOTE — ED Provider Notes (Signed)
[facility]    CSN: [id number] Arrival date & time: 05/27/23  0913      History   Chief Complaint Chief Complaint  Patient presents with   Neck Injury    Neck pain leading up into skull, sharp pains in head. - Entered by patient    HPI Jade Kelly is a 37 y.o. female.   Patient presents today with 2-week history of right-sided neck pain.  Reports the pain is currently a 4 out of 10 at the base of the right side of her skull.  She also endorses throbbing headache that she rates as a 6 out of 10, worse when she coughs, laughs, sneezes, or bends over.  No blurred vision no double vision, photophobia, vomiting, change in behavior, or change in mental status.  She has has had a little bit of nausea yesterday that is now improved.  Has been taking half tablets of both Advil and Tylenol with minimal improvement.  Reports she does not like to take the full tablets because she does not want her kidneys to be affected.  No numbness or tingling in the upper extremities.  No weakness of the upper extremities.  She endorses neck stiffness, however is fully able to move her neck.  No recent trauma or injury to the head or neck.     Past Medical History:  Diagnosis Date   Anxiety    Asthma    prn inhaler   Breast mass, right 11/2013   Depression    Family history of anesthesia complication    maternal grandmother has hx. 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/05/2021   Otitis externa 09/05/2021   Solitary cyst of breast 09/05/2021   Abnormal auditory perception  of right ear 08/07/2021   Dizziness 08/07/2021   Excessive cerumen in both ear canals 08/07/2021   Post-operative state 12/17/2013   Metatarsal fracture 06/30/2012    Past Surgical History:  Procedure Laterality Date   BREAST LUMPECTOMY WITH RADIOACTIVE SEED LOCALIZATION Right 11/26/2018   Procedure: RIGHT BREAST LUMPECTOMY WITH RADIOACTIVE SEED LOCALIZATION;  Surgeon: Harriette Bouillon, MD;  Location: [facility];  Service: General;  Laterality: Right;   EXCISION OF BREAST BIOPSY Right 12/02/2013   Procedure: RIGHT EXCISIONAL BREAST BIOPSY;  Surgeon: Clovis Pu. Cornett, MD;  Location: [facility];  Service: General;  Laterality: Right;   WISDOM TOOTH EXTRACTION      OB History   No obstetric history on file.      Home Medications    Prior to Admission medications   Medication Sig Start Date End Date Taking? Authorizing Provider  tiZANidine (ZANAFLEX) 4 MG tablet Take 1 tablet (4 mg total) by mouth every 8 (eight) hours as needed for muscle spasms. Do not take with alcohol or while driving or operating heavy machinery.  May cause drowsiness. 05/27/23  Yes Valentino Nose, NP  albuterol (VENTOLIN HFA) 108 (90 Base) MCG/ACT inhaler Inhale 2 puffs into the lungs every 6 (six) hours as needed for wheezing or shortness of breath.     [provider]  ALPRAZolam (  XANAX) 0.25 MG tablet Take 0.25 mg by mouth daily as needed for anxiety.    [provider]  aspirin 81 MG chewable tablet Chew 81 mg by mouth as needed.    [provider]  buPROPion (WELLBUTRIN SR) 150 MG 12 hr tablet Take 150 mg by mouth daily.    [provider]  clindamycin (CLEOCIN T) 1 % external solution Apply 1 application topically daily. Applied to face 11/17/18   [provider]  doxycycline (VIBRAMYCIN) 100 MG capsule Take 100 mg by mouth as needed (face breakouts). 12/25/22   [provider]  FLUoxetine (PROZAC) 20 MG capsule Take 60 mg by mouth daily.     [provider]  ibuprofen (ADVIL) 600 MG tablet Take 1 tablet (600 mg total) by mouth every 6 (six) hours as needed. 09/02/21   Domenick Gong, MD  loratadine (CLARITIN) 10 MG tablet Take 10 mg by mouth as needed for allergies.    [provider]  ondansetron (ZOFRAN-ODT) 4 MG disintegrating tablet Take 1 tablet (4 mg total) by mouth every 8 (eight) hours as needed for nausea or vomiting. 02/14/20   Benjiman Core, MD    Family History Family History  Problem Relation Age of Onset   Anesthesia problems Maternal Grandmother        post-op nausea    Social History Social History   Tobacco Use   Smoking status: Never   Smokeless tobacco: Never  Vaping Use   Vaping status: Never Used  Substance Use Topics   Alcohol use: No   Drug use: No     Allergies   Penicillins and Sulfa antibiotics   Review of Systems Review of Systems Per HPI  Physical Exam Triage Vital Signs ED Triage Vitals  Encounter Vitals Group     BP 05/27/23 0942 108/64     Systolic BP Percentile --      Diastolic BP Percentile --      Pulse Rate 05/27/23 0942 91     Resp 05/27/23 0942 15     Temp 05/27/23 0942 98.2 F (36.8 C)     Temp Source 05/27/23 0942 Oral     SpO2 05/27/23 0942 96 %     Weight --      Height --      Head Circumference --      Peak Flow --      Pain Score 05/27/23 0944 10     Pain Loc --      Pain Education --      Exclude from Growth Chart --    No data found.  Updated Vital Signs BP 108/64 (BP Location: Right Arm)   Pulse 91   Temp 98.2 F (36.8 C) (Oral)   Resp 15   LMP 05/27/2023 (Exact Date)   SpO2 96%   Visual Acuity Right Eye Distance:   Left Eye Distance:   Bilateral Distance:    Right Eye Near:   Left Eye Near:    Bilateral Near:     Physical Exam Vitals and nursing note reviewed.  Constitutional:      General: She is not in acute distress.    Appearance: Normal appearance. She is not toxic-appearing.  HENT:     Head:  Normocephalic and atraumatic.     Right Ear: External ear normal.     Left Ear: External ear normal.     Nose: Nose normal. No congestion or rhinorrhea.     Mouth/Throat:  Mouth: Mucous membranes are moist.     Pharynx: Oropharynx is clear.  Eyes:     General:        Right eye: No discharge.        Left eye: No discharge.     Extraocular Movements: Extraocular movements intact.     Pupils: Pupils are equal, round, and reactive to light.  Neck:   Pulmonary:     Effort: Pulmonary effort is normal. No respiratory distress.  Musculoskeletal:     Cervical back: Normal range of motion. Tenderness present. No rigidity.     Comments: Patient moving all 4 extremities equally and without difficulty  Lymphadenopathy:     Cervical: No cervical adenopathy.  Skin:    General: Skin is warm and dry.     Capillary Refill: Capillary refill takes less than 2 seconds.     Coloration: Skin is not jaundiced or pale.     Findings: No erythema.  Neurological:     General: No focal deficit present.     Mental Status: She is alert and oriented to person, place, and time.     Cranial Nerves: Cranial nerves 2-12 are intact.     Motor: Motor function is intact. No weakness.     Coordination: Coordination is intact. Coordination normal.     Gait: Gait normal.  Psychiatric:        Behavior: Behavior is cooperative.      UC Treatments / Results  Labs (all labs ordered are listed, but only abnormal results are displayed) Labs Reviewed - No data to display  EKG   Radiology No results found.  Procedures Procedures (including critical care time)  Medications Ordered in UC Medications - No data to display  Initial Impression / Assessment and Plan / UC Course  I have reviewed the triage vital signs and the nursing notes.  Pertinent labs & imaging results that were available during my care of the patient were reviewed by me and considered in my medical decision making (see chart for  details).  Patient is well-appearing, normotensive, afebrile, not tachycardic, not tachypneic, oxygenating well on room air.    1. Neck pain 2. Nonintractable headache, unspecified chronicity pattern, unspecified headache type Query tension type headache as cause of pain No red flags in history or on examination today Start Tylenol [phone] mg every 6 hours for pain Start muscle relaxant   Follow up with PCP if symptoms persist Recommended evaluation in ER if symptoms worsen  The patient was given the opportunity to ask questions.  All questions answered to their satisfaction.  The patient is in agreement to this plan.    Final Clinical Impressions(s) / UC Diagnoses   Final diagnoses:  Neck pain  Nonintractable headache, unspecified chronicity pattern, unspecified headache type     Discharge Instructions      Start taking Tylenol 500 to 1000 mg every 6 hours as needed for the neck pain and headache.  You can also start the tizanidine and take as needed for muscular pain.  Please start the neck exercises/stretches.  Follow-up with your primary care provider if symptoms persist or worsen despite treatment.    ED Prescriptions     Medication Sig Dispense Auth. Provider   tiZANidine (ZANAFLEX) 4 MG tablet Take 1 tablet (4 mg total) by mouth every 8 (eight) hours as needed for muscle spasms. Do not take with alcohol or while driving or operating heavy machinery.  May cause drowsiness. 30 tablet Valentino Nose, NP  PDMP not reviewed this encounter.   Valentino Nose, NP 05/27/23 1234

## 2023-05-27 NOTE — Telephone Encounter (Signed)
Pt called and reported pharmacy never received px. Consulted NP and verified pharmacy. Provider reported would send over px.

## 2023-05-27 NOTE — Discharge Instructions (Signed)
Start taking Tylenol 500 to 1000 mg every 6 hours as needed for the neck pain and headache.  You can also start the tizanidine and take as needed for muscular pain.  Please start the neck exercises/stretches.  Follow-up with your primary care provider if symptoms persist or worsen despite treatment.

## 2023-05-27 NOTE — Telephone Encounter (Signed)
I will forward to Dr.Mallipeddi to result echo

## 2023-05-27 NOTE — ED Triage Notes (Signed)
Pt c/o right sided pain leading from neck into skulls that are sharp and shooting headache x 1 week, which got increasingly worse last night.

## 2023-05-27 NOTE — Telephone Encounter (Signed)
Patient's grandmother called in to discuss echo results. She states the call can be returned to the patient.

## 2023-06-04 NOTE — Telephone Encounter (Signed)
Pt is requesting a callback regarding her ECHO results. She stated she's been waiting to be contacted but no one has reached out to her. Please advise

## 2023-06-04 NOTE — Telephone Encounter (Signed)
Notified pt that provider is working on resulting ECHO. Pt thankful for the call.

## 2023-06-06 NOTE — Telephone Encounter (Addendum)
Patient notified and verbalized understanding. Patient had no questions or concerns at this time.  

## 2023-07-06 ENCOUNTER — Emergency Department (HOSPITAL_COMMUNITY)
Admission: EM | Admit: 2023-07-06 | Discharge: 2023-07-06 | Disposition: A | Discharge status: Home / Self Care | Payer: Medicaid Other | Attending: Emergency Medicine | Admitting: Emergency Medicine

## 2023-07-06 ENCOUNTER — Other Ambulatory Visit: Payer: Self-pay

## 2023-07-06 ENCOUNTER — Ambulatory Visit: Payer: Self-pay

## 2023-07-06 ENCOUNTER — Encounter (HOSPITAL_COMMUNITY): Payer: Self-pay | Admitting: Emergency Medicine

## 2023-07-06 ENCOUNTER — Encounter (HOSPITAL_COMMUNITY): Payer: Self-pay

## 2023-07-06 ENCOUNTER — Emergency Department (HOSPITAL_COMMUNITY)
Admission: EM | Admit: 2023-07-06 | Discharge: 2023-07-07 | Disposition: A | Discharge status: Home / Self Care | Payer: Medicaid Other | Source: Home / Self Care | Attending: Emergency Medicine | Admitting: Emergency Medicine

## 2023-07-06 DIAGNOSIS — H9201 Otalgia, right ear: Secondary | ICD-10-CM | POA: Diagnosis present

## 2023-07-06 DIAGNOSIS — H60501 Unspecified acute noninfective otitis externa, right ear: Secondary | ICD-10-CM | POA: Diagnosis not present

## 2023-07-06 DIAGNOSIS — J45909 Unspecified asthma, uncomplicated: Secondary | ICD-10-CM | POA: Insufficient documentation

## 2023-07-06 DIAGNOSIS — Z7982 Long term (current) use of aspirin: Secondary | ICD-10-CM | POA: Insufficient documentation

## 2023-07-06 DIAGNOSIS — H60331 Swimmer's ear, right ear: Secondary | ICD-10-CM | POA: Insufficient documentation

## 2023-07-06 MED ORDER — CIPROFLOXACIN-DEXAMETHASONE 0.3-0.1 % OT SUSP: 4.0000 [drp] | Freq: Two times a day (BID) | OTIC | 0 refills | Status: DC

## 2023-07-06 MED ORDER — DOXYCYCLINE HYCLATE 100 MG PO CAPS: 100.0000 mg | ORAL_CAPSULE | ORAL | 0 refills | Status: AC | PRN

## 2023-07-06 MED ORDER — ACETAMINOPHEN 325 MG PO TABS
650.0000 mg | ORAL_TABLET | Freq: Once | ORAL | Status: AC
  Administered 2023-07-06: 650 mg via ORAL
  Filled 2023-07-06: qty 2

## 2023-07-06 MED ORDER — DOXYCYCLINE HYCLATE 100 MG PO TABS
100.0000 mg | ORAL_TABLET | Freq: Once | ORAL | Status: AC
  Administered 2023-07-06: 100 mg via ORAL
  Filled 2023-07-06: qty 1

## 2023-07-06 MED ORDER — CIPROFLOXACIN-DEXAMETHASONE 0.3-0.1 % OT SUSP
4.0000 [drp] | Freq: Two times a day (BID) | OTIC | Status: DC
  Administered 2023-07-06: 4 [drp] via OTIC
  Filled 2023-07-06: qty 7.5

## 2023-07-06 MED ORDER — KETOROLAC TROMETHAMINE 60 MG/2ML IM SOLN
60.0000 mg | Freq: Once | INTRAMUSCULAR | Status: AC
  Administered 2023-07-06: 60 mg via INTRAMUSCULAR
  Filled 2023-07-06: qty 2

## 2023-07-06 NOTE — ED Triage Notes (Signed)
Pt arrived via POV from home c/o recent ear infection. Pt reports she has reached her 4000mg  daily limit of Tylenol w/o relief, and has been using the prescribed ear drops w/o relief. Pt sen here last night for same. Pt reports pain is uncontrolled.

## 2023-07-06 NOTE — Discharge Instructions (Addendum)
You may take ibuprofen and/or acetaminophen as needed for pain.  Please note that if you combine ibuprofen and acetaminophen, you will get better pain relief and you get from taking either medication by itself.

## 2023-07-06 NOTE — ED Notes (Signed)
Patient called out requesting pain medication. Message sent to provider

## 2023-07-06 NOTE — ED Provider Notes (Signed)
[place] [facility] Provider Note   CSN: [id number] Arrival date & time: 07/06/23  0359     History  Chief Complaint  Patient presents with   Ear Pain    Jade Kelly is a 37 y.o. female.  The history is provided by the patient.  She has history of asthma, anxiety, GERD and comes in complaining of right ear pain for the last 2 days which is getting worse.  She denies any fever or chills and denies any difficulty hearing.  She has had problems with both inner and outer ear infections in the past.  Symptoms started when she had been crying in tears got into her ear canal.   Home Medications Prior to Admission medications   Medication Sig Start Date End Date Taking? Authorizing Provider  albuterol (VENTOLIN HFA) 108 (90 Base) MCG/ACT inhaler Inhale 2 puffs into the lungs every 6 (six) hours as needed for wheezing or shortness of breath.     [provider]  ALPRAZolam Prudy Feeler) 0.25 MG tablet Take 0.25 mg by mouth daily as needed for anxiety.    [provider]  aspirin 81 MG chewable tablet Chew 81 mg by mouth as needed.    [provider]  buPROPion (WELLBUTRIN SR) 150 MG 12 hr tablet Take 150 mg by mouth daily.    [provider]  clindamycin (CLEOCIN T) 1 % external solution Apply 1 application topically daily. Applied to face 11/17/18   [provider]  doxycycline (VIBRAMYCIN) 100 MG capsule Take 100 mg by mouth as needed (face breakouts). 12/25/22   [provider]  FLUoxetine (PROZAC) 20 MG capsule Take 60 mg by mouth daily.    [provider]  ibuprofen (ADVIL) 600 MG tablet Take 1 tablet (600 mg total) by mouth every 6 (six) hours as needed. 09/02/21   Domenick Gong, MD  loratadine (CLARITIN) 10 MG tablet Take 10 mg by mouth as needed for allergies.    [provider]  ondansetron (ZOFRAN-ODT) 4 MG disintegrating tablet Take 1 tablet (4 mg total) by mouth every 8  (eight) hours as needed for nausea or vomiting. 02/14/20   Benjiman Core, MD  tiZANidine (ZANAFLEX) 4 MG tablet Take 1 tablet (4 mg total) by mouth every 8 (eight) hours as needed for muscle spasms. Do not take with alcohol or while driving or operating heavy machinery.  May cause drowsiness. 05/27/23   Valentino Nose, NP      Allergies    Penicillins and Sulfa antibiotics    Review of Systems   Review of Systems  All other systems reviewed and are negative.   Physical Exam Updated Vital Signs BP (!) 137/94 (BP Location: Left Arm)   Pulse 85   Temp 98.5 F (36.9 C) (Oral)   Resp 18   Ht 5\' 7"  (1.702 m)   Wt 99.8 kg   SpO2 95%   BMI 34.46 kg/m  Physical Exam Vitals and nursing note reviewed.   37 year old female, in obvious pain, but in no acute distress. Vital signs are significant for borderline elevated blood pressure. Oxygen saturation is 95%, which is normal. Head is normocephalic and atraumatic. PERRLA, EOMI. Left tympanic membrane is clear.  There is pain elicited when tension is applied to the helix of the right ear.  Right external auditory canal is edematous and tender, but tympanic membrane is normal. Neck is nontender and supple without adenopathy. Lungs are clear without rales, wheezes,  or rhonchi. Chest is nontender. Heart has regular rate and rhythm without murmur. Abdomen is soft, flat, nontender. Skin is warm and dry without rash. Neurologic: Mental status is normal, cranial nerves are intact, moves all extremities equally.  ED Results / Procedures / Treatments    Procedures Procedures    Medications Ordered in ED Medications  ketorolac (TORADOL) injection 60 mg (has no administration in time range)  ciprofloxacin-dexamethasone (CIPRODEX) 0.3-0.1 % OTIC (EAR) suspension 4 drop (has no administration in time range)  doxycycline (VIBRA-TABS) tablet 100 mg (has no administration in time range)  acetaminophen (TYLENOL) tablet 650 mg (has no  administration in time range)    ED Course/ Medical Decision Making/ A&P                                 Medical Decision Making  Right otitis externa.  I have reviewed her past records, and note an urgent care visit on 02/24/2023 for otitis externa, ED visit 09/05/2021 for otitis externa.  I am ordering a dose of ketorolac intramuscularly and oral acetaminophen for pain.  I have ordered a dose of ciprofloxacin-dexamethasone suspension in her right ear and also dose of oral doxycycline.  I am discharging her with prescriptions for ciprofloxacin-dexamethasone otic suspension and doxycycline.  She is to use over-the-counter NSAIDs and acetaminophen as needed for pain.  Final Clinical Impression(s) / ED Diagnoses Final diagnoses:  Acute otitis externa of right ear, unspecified type    Rx / DC Orders ED Discharge Orders          Ordered    ciprofloxacin-dexamethasone (CIPRODEX) OTIC suspension  2 times daily        07/06/23 0504    doxycycline (VIBRAMYCIN) 100 MG capsule  As needed        07/06/23 0504              Dione Booze, MD 07/06/23 0505

## 2023-07-06 NOTE — ED Triage Notes (Signed)
Pt to ED from home c/o right ear pain since Friday.  States hx of swimmer's ear and ear aches.  Denies bleeding or drainage or fevers.  Took tylenol at home around 2230.

## 2023-07-07 ENCOUNTER — Ambulatory Visit
Admission: RE | Admit: 2023-07-07 | Discharge: 2023-07-07 | Disposition: A | Discharge status: Home / Self Care | Payer: Medicaid Other | Source: Ambulatory Visit | Attending: Internal Medicine | Admitting: Internal Medicine

## 2023-07-07 ENCOUNTER — Ambulatory Visit: Payer: Self-pay

## 2023-07-07 VITALS — BP 109/66 | HR 91 | Temp 98.8°F | Resp 20

## 2023-07-07 DIAGNOSIS — H60391 Other infective otitis externa, right ear: Secondary | ICD-10-CM | POA: Diagnosis not present

## 2023-07-07 MED ORDER — TRAMADOL HCL 50 MG PO TABS: 50.0000 mg | ORAL_TABLET | Freq: Four times a day (QID) | ORAL | 0 refills | Status: DC | PRN

## 2023-07-07 MED ORDER — TRAMADOL HCL 50 MG PO TABS
50.0000 mg | ORAL_TABLET | Freq: Once | ORAL | Status: AC
  Administered 2023-07-07: 50 mg via ORAL
  Filled 2023-07-07: qty 1

## 2023-07-07 MED ORDER — TRAMADOL HCL 50 MG PO TABS: 50.0000 mg | ORAL_TABLET | Freq: Four times a day (QID) | ORAL | 0 refills | Status: AC | PRN

## 2023-07-07 NOTE — ED Triage Notes (Signed)
Pt reports she has bad right side ear pain x 3 days.

## 2023-07-07 NOTE — Discharge Instructions (Signed)
Continue the antibiotic drops and doxycycline, follow-up with primary care for a recheck and return sooner for worsening symptoms at any time

## 2023-07-07 NOTE — Discharge Instructions (Signed)
Continue taking the antibiotic and continue using the eardrops as prescribed.  Continue taking ibuprofen and/or acetaminophen as needed for pain.  Take tramadol as needed for pain not relieved by the combination of ibuprofen and acetaminophen.

## 2023-07-07 NOTE — ED Provider Notes (Signed)
[place] EMERGENCY DEPARTMENT AT [facility] Provider Note   CSN: [id number] Arrival date & time: 07/06/23  2327     History  Chief Complaint  Patient presents with   Otitis Media    Jade Kelly is a 37 y.o. female.  The history is provided by the patient.  She was in the emergency department yesterday and diagnosed with otitis externa of the right ear and sent home with prescription for Ciprodex eardrops and doxycycline and told use over-the-counter acetaminophen and NSAIDs for pain.  She has been taking the medications as prescribed and using the acetaminophen and and ibuprofen for pain but has not been able to get relief.  She has not run a fever.   Home Medications Prior to Admission medications   Medication Sig Start Date End Date Taking? Authorizing Provider  traMADol (ULTRAM) 50 MG tablet Take 1 tablet (50 mg total) by mouth every 6 (six) hours as needed. 07/07/23  Yes Dione Booze, MD  albuterol (VENTOLIN HFA) 108 (90 Base) MCG/ACT inhaler Inhale 2 puffs into the lungs every 6 (six) hours as needed for wheezing or shortness of breath.     [provider]  ALPRAZolam Prudy Feeler) 0.25 MG tablet Take 0.25 mg by mouth daily as needed for anxiety.    [provider]  aspirin 81 MG chewable tablet Chew 81 mg by mouth as needed.    [provider]  buPROPion (WELLBUTRIN SR) 150 MG 12 hr tablet Take 150 mg by mouth daily.    [provider]  ciprofloxacin-dexamethasone (CIPRODEX) OTIC suspension Place 4 drops into the right ear 2 (two) times daily. 07/06/23   Dione Booze, MD  clindamycin (CLEOCIN T) 1 % external solution Apply 1 application topically daily. Applied to face 11/17/18   [provider]  doxycycline (VIBRAMYCIN) 100 MG capsule Take 1 capsule (100 mg total) by mouth as needed (face breakouts). 07/06/23   Dione Booze, MD  FLUoxetine (PROZAC) 20 MG capsule Take 60 mg by mouth daily.    [provider]  ibuprofen  (ADVIL) 600 MG tablet Take 1 tablet (600 mg total) by mouth every 6 (six) hours as needed. 09/02/21   Domenick Gong, MD  loratadine (CLARITIN) 10 MG tablet Take 10 mg by mouth as needed for allergies.    [provider]  ondansetron (ZOFRAN-ODT) 4 MG disintegrating tablet Take 1 tablet (4 mg total) by mouth every 8 (eight) hours as needed for nausea or vomiting. 02/14/20   Benjiman Core, MD  tiZANidine (ZANAFLEX) 4 MG tablet Take 1 tablet (4 mg total) by mouth every 8 (eight) hours as needed for muscle spasms. Do not take with alcohol or while driving or operating heavy machinery.  May cause drowsiness. 05/27/23   Valentino Nose, NP      Allergies    Penicillins and Sulfa antibiotics    Review of Systems   Review of Systems  All other systems reviewed and are negative.   Physical Exam Updated Vital Signs BP 137/75 (BP Location: Left Arm)   Pulse 84   Temp 98 F (36.7 C) (Oral)   Resp 18   Ht 5\' 7"  (1.702 m)   Wt 99.8 kg   SpO2 95%   BMI 34.46 kg/m  Physical Exam Vitals and nursing note reviewed.   37 year old female, resting comfortably and in no acute distress. Vital signs are normal. Oxygen saturation is 95%, which is normal. Head is normocephalic and atraumatic. PERRLA, EOMI. Right tympanic  membrane is clear, but there is ongoing edema and tenderness of the external auditory canal and pain is elicited when tension is applied to the helix of the right ear. Neck is nontender and supple without adenopathy. Lungs are clear without rales, wheezes, or rhonchi. Chest is nontender. Heart has regular rate and rhythm without murmur. Abdomen is soft, flat, nontender. Skin is warm and dry without rash. Neurologic: Mental status is normal, cranial nerves are intact, moves all extremities equally.  ED Results / Procedures / Treatments    Procedures Procedures    Medications Ordered in ED Medications  traMADol (ULTRAM) tablet 50 mg (has no administration in time  range)    ED Course/ Medical Decision Making/ A&P                                 Medical Decision Making Risk Prescription drug management.   Otitis externa of the right ear which has failed to respond to 1 day of treatment.  At this point, she needs to continue her current antibiotics of Ciprodex drops and doxycycline.  I have explained to the patient that she will need narcotics if she wishes to get additional pain relief.  She states that she has had bad experiences with oxycodone, but Darvocet had helped her in the past.  I have explained to her that that is no longer sold and the [place].  I have ordered a dose of tramadol and I am discharging her with a prescription for tramadol.  She is to continue her current treatment, reassess if no clinical response in an additional 24-48 hours.  Final Clinical Impression(s) / ED Diagnoses Final diagnoses:  Acute swimmer's ear of right side    Rx / DC Orders ED Discharge Orders          Ordered    traMADol (ULTRAM) 50 MG tablet  Every 6 hours PRN        07/07/23 0117              Dione Booze, MD 07/07/23 0121

## 2023-07-10 NOTE — ED Provider Notes (Signed)
[facility]    CSN: [id number] Arrival date & time: 07/07/23  1452      History   Chief Complaint Chief Complaint  Patient presents with   Ear Fullness    HPI Jade Kelly is a 37 y.o. female.   Presenting today following up on 2 ER visits yesterday for 3 day history of right ear pain. Was diagnosed with otitis externa and given ciprodex drops and doxycycline yesterday, then returned later in the day due to severe pain and given tramadol which she states is not controlling her pain in addition to ibuprofen and tylenol. Denies fever, chills, drainage from ear, N/V, headache. States pain is still severe and she is wondering if she's on the correct medications.     Past Medical History:  Diagnosis Date   Anxiety    Asthma    prn inhaler   Breast mass, right 11/2013   Depression    Family history of anesthesia complication    maternal grandmother has hx. 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/05/2021   Otitis externa 09/05/2021   Solitary cyst of breast 09/05/2021   Abnormal auditory perception of right ear 08/07/2021   Dizziness 08/07/2021   Excessive cerumen in both ear canals 08/07/2021   Post-operative state 12/17/2013   Metatarsal fracture 06/30/2012    Past Surgical History:  Procedure Laterality Date   BREAST LUMPECTOMY WITH RADIOACTIVE SEED LOCALIZATION Right 11/26/2018   Procedure: RIGHT BREAST LUMPECTOMY WITH RADIOACTIVE SEED LOCALIZATION;  Surgeon: Harriette Bouillon, MD;  Location: [facility] OR;  Service: General;  Laterality: Right;    EXCISION OF BREAST BIOPSY Right 12/02/2013   Procedure: RIGHT EXCISIONAL BREAST BIOPSY;  Surgeon: Clovis Pu. Cornett, MD;  Location: [facility];  Service: General;  Laterality: Right;   WISDOM TOOTH EXTRACTION      OB History   No obstetric history on file.      Home Medications    Prior to Admission medications   Medication Sig Start Date End Date Taking? Authorizing Provider  albuterol (VENTOLIN HFA) 108 (90 Base) MCG/ACT inhaler Inhale 2 puffs into the lungs every 6 (six) hours as needed for wheezing or shortness of breath.     [provider]  ALPRAZolam Prudy Feeler) 0.25 MG tablet Take 0.25 mg by mouth daily as needed for anxiety.    [provider]  aspirin 81 MG chewable tablet Chew 81 mg by mouth as needed.    [provider]  buPROPion (WELLBUTRIN SR) 150 MG 12 hr tablet Take 150 mg by mouth daily.    [provider]  ciprofloxacin-dexamethasone (CIPRODEX) OTIC suspension Place 4 drops into the right ear 2 (two) times daily. 07/06/23   Dione Booze, MD  clindamycin (CLEOCIN T) 1 % external solution Apply 1 application topically daily. Applied to face 11/17/18   [provider]  doxycycline (VIBRAMYCIN) 100 MG capsule Take 1 capsule (100 mg total) by mouth as needed (face breakouts). 07/06/23   Dione Booze, MD  FLUoxetine (PROZAC) 20 MG capsule Take 60 mg by mouth daily.    [provider]  ibuprofen (ADVIL) 600 MG tablet Take 1 tablet (600 mg total) by mouth every 6 (six) hours as needed. 09/02/21   Domenick Gong, MD  loratadine (CLARITIN) 10 MG tablet Take 10 mg by mouth as needed for allergies.    [provider]  ondansetron (ZOFRAN-ODT) 4 MG disintegrating tablet Take 1 tablet (4 mg total) by mouth every 8 (eight) hours as needed for nausea or vomiting. 02/14/20   Benjiman Core, MD  tiZANidine (ZANAFLEX) 4 MG tablet Take 1 tablet (4 mg total) by mouth every 8 (eight) hours as needed for muscle spasms. Do  not take with alcohol or while driving or operating heavy machinery.  May cause drowsiness. 05/27/23   Valentino Nose, NP  traMADol (ULTRAM) 50 MG tablet Take 1 tablet (50 mg total) by mouth every 6 (six) hours as needed. 07/07/23   Dione Booze, MD    Family History Family History  Problem Relation Age of Onset   Anesthesia problems Maternal Grandmother        post-op nausea    Social History Social History   Tobacco Use   Smoking status: Never   Smokeless tobacco: Never  Vaping Use   Vaping status: Never Used  Substance Use Topics   Alcohol use: No   Drug use: No     Allergies   Penicillins and Sulfa antibiotics   Review of Systems Review of Systems PER HPI  Physical Exam Triage Vital Signs ED Triage Vitals  Encounter Vitals Group     BP 07/07/23 1526 109/66     Systolic BP Percentile --      Diastolic BP Percentile --      Pulse Rate 07/07/23 1526 91     Resp 07/07/23 1526 20     Temp 07/07/23 1526 98.8 F (37.1 C)     Temp Source 07/07/23 1526 Oral     SpO2 07/07/23 1526 98 %     Weight --      Height --      Head Circumference --      Peak Flow --      Pain Score 07/07/23 1527 7     Pain Loc --      Pain Education --      Exclude from Growth Chart --    No data found.  Updated Vital Signs BP 109/66 (BP Location: Right Arm)   Pulse 91   Temp 98.8 F (37.1 C) (Oral)   Resp 20   LMP 06/25/2023   SpO2 98%   Visual Acuity Right Eye Distance:   Left Eye Distance:   Bilateral Distance:    Right Eye Near:   Left Eye Near:    Bilateral Near:     Physical Exam Vitals and nursing note reviewed.  Constitutional:      Appearance: Normal appearance. She is not ill-appearing.  HENT:     Head: Atraumatic.     Left Ear: Tympanic membrane and external ear normal.     Ears:     Comments: Right EAC erythematous, edematous but patent. TM benign    Nose: Nose normal.     Mouth/Throat:     Mouth: Mucous membranes are moist.     Pharynx:  Oropharynx is clear.  Eyes:     Extraocular Movements: Extraocular movements intact.     Conjunctiva/sclera: Conjunctivae normal.  Cardiovascular:     Rate and Rhythm: Normal rate and regular rhythm.     Heart sounds: Normal heart sounds.  Pulmonary:  Effort: Pulmonary effort is normal.     Breath sounds: Normal breath sounds.  Musculoskeletal:        General: Normal range of motion.     Cervical back: Normal range of motion and neck supple.  Skin:    General: Skin is warm and dry.  Neurological:     Mental Status: She is alert and oriented to person, place, and time.  Psychiatric:        Mood and Affect: Mood normal.        Thought Content: Thought content normal.        Judgment: Judgment normal.      UC Treatments / Results  Labs (all labs ordered are listed, but only abnormal results are displayed) Labs Reviewed - No data to display  EKG   Radiology No results found.  Procedures Procedures (including critical care time)  Medications Ordered in UC Medications - No data to display  Initial Impression / Assessment and Plan / UC Course  I have reviewed the triage vital signs and the nursing notes.  Pertinent labs & imaging results that were available during my care of the patient were reviewed by me and considered in my medical decision making (see chart for details).     Reassured pt that she is on all of the correct medications and needs to give things more time for improvement. Continue OTC pain relievers and tramadol in addition to the oral and topical abx. Return for worsening sxs.  Final Clinical Impressions(s) / UC Diagnoses   Final diagnoses:  Other infective acute otitis externa of right ear     Discharge Instructions      Continue the antibiotic drops and doxycycline, follow-up with primary care for a recheck and return sooner for worsening symptoms at any time    ED Prescriptions   None    I have reviewed the PDMP during this  encounter.   Particia Nearing, [place] 07/10/23 2225

## 2023-09-06 ENCOUNTER — Inpatient Hospital Stay
Admission: RE | Admit: 2023-09-06 | Discharge: 2023-09-06 | Discharge status: Home / Self Care | Payer: Self-pay | Source: Ambulatory Visit | Attending: Family Medicine

## 2023-09-06 ENCOUNTER — Other Ambulatory Visit: Payer: Self-pay

## 2023-09-06 VITALS — BP 109/78 | HR 100 | Temp 98.6°F | Resp 20

## 2023-09-06 DIAGNOSIS — H60503 Unspecified acute noninfective otitis externa, bilateral: Secondary | ICD-10-CM

## 2023-09-06 DIAGNOSIS — H6993 Unspecified Eustachian tube disorder, bilateral: Secondary | ICD-10-CM | POA: Diagnosis not present

## 2023-09-06 MED ORDER — PSEUDOEPHEDRINE HCL 60 MG PO TABS: 60.0000 mg | ORAL_TABLET | Freq: Three times a day (TID) | ORAL | 0 refills | Status: AC | PRN

## 2023-09-06 MED ORDER — FLUTICASONE PROPIONATE 50 MCG/ACT NA SUSP: 1.0000 | Freq: Two times a day (BID) | NASAL | 2 refills | Status: DC

## 2023-09-06 MED ORDER — CIPROFLOXACIN-DEXAMETHASONE 0.3-0.1 % OT SUSP: 4.0000 [drp] | Freq: Two times a day (BID) | OTIC | 0 refills | Status: AC

## 2023-09-06 NOTE — ED Triage Notes (Signed)
Pt reports bilateral ear pain, headache for last several days. Had leftover abx ear drops and started using those and reports helped with pain but reports has returned since run out. Denies any known injury.

## 2023-09-10 NOTE — ED Provider Notes (Signed)
[facility]    CSN: [id number] Arrival date & time: 09/06/23  1052      History   Chief Complaint Chief Complaint  Patient presents with   Ear Fullness    Possible ear infection again - Entered by patient    HPI Jade Kelly is a 37 y.o. female.   Presenting today with b/l ear pain, headache. Denies fever, chills, drainage, cough, congestion. Has been using leftover antibiotic ear drops with some relief but ran out and pain returned.     Past Medical History:  Diagnosis Date   Anxiety    Asthma    prn inhaler   Breast mass, right 11/2013   Depression    Family history of anesthesia complication    maternal grandmother has hx. 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/05/2021   Otitis externa 09/05/2021   Solitary cyst of breast 09/05/2021   Abnormal auditory perception of right ear 08/07/2021   Dizziness 08/07/2021   Excessive cerumen in both ear canals 08/07/2021   Post-operative state 12/17/2013   Metatarsal fracture 06/30/2012    Past Surgical History:  Procedure Laterality Date   BREAST LUMPECTOMY WITH RADIOACTIVE SEED LOCALIZATION Right 11/26/2018   Procedure: RIGHT BREAST LUMPECTOMY WITH RADIOACTIVE SEED LOCALIZATION;  Surgeon: Harriette Bouillon, MD;  Location: [facility];  Service: General;  Laterality: Right;   EXCISION OF BREAST BIOPSY Right 12/02/2013   Procedure: RIGHT EXCISIONAL BREAST BIOPSY;  Surgeon: Clovis Pu. Cornett, MD;  Location: [facility];  Service: General;  Laterality: Right;   WISDOM TOOTH EXTRACTION       OB History   No obstetric history on file.      Home Medications    Prior to Admission medications   Medication Sig Start Date End Date Taking? Authorizing Provider  fluticasone (FLONASE) 50 MCG/ACT nasal spray Place 1 spray into both nostrils 2 (two) times daily. 09/06/23  Yes Particia Nearing, PA-C  pseudoephedrine (SUDAFED) 60 MG tablet Take 1 tablet (60 mg total) by mouth every 8 (eight) hours as needed for congestion. 09/06/23  Yes Particia Nearing, PA-C  albuterol (VENTOLIN HFA) 108 (90 Base) MCG/ACT inhaler Inhale 2 puffs into the lungs every 6 (six) hours as needed for wheezing or shortness of breath.     [provider]  ALPRAZolam Prudy Feeler) 0.25 MG tablet Take 0.25 mg by mouth daily as needed for anxiety.    [provider]  aspirin 81 MG chewable tablet Chew 81 mg by mouth as needed.    [provider]  buPROPion (WELLBUTRIN SR) 150 MG 12 hr tablet Take 150 mg by mouth daily.    [provider]  ciprofloxacin-dexamethasone (CIPRODEX) OTIC suspension Place 4 drops into the right ear 2 (two) times daily. 09/06/23   Particia Nearing, PA-C  clindamycin (CLEOCIN T) 1 % external solution Apply 1 application topically daily. Applied to face 11/17/18   [provider]  doxycycline (VIBRAMYCIN) 100 MG capsule Take 1 capsule (100 mg total) by mouth as needed (face breakouts). 07/06/23   Dione Booze, MD  FLUoxetine [facility])  20 MG capsule Take 60 mg by mouth daily.    [provider]  ibuprofen (ADVIL) 600 MG tablet Take 1 tablet (600 mg total) by mouth every 6 (six) hours as needed. 09/02/21   Domenick Gong, MD  loratadine (CLARITIN) 10 MG tablet Take 10 mg by mouth as needed for allergies.    [provider]  ondansetron (ZOFRAN-ODT) 4 MG disintegrating tablet Take 1 tablet (4 mg total) by mouth every 8 (eight) hours as needed for nausea or vomiting. 02/14/20   Benjiman Core, MD  tiZANidine (ZANAFLEX) 4 MG  tablet Take 1 tablet (4 mg total) by mouth every 8 (eight) hours as needed for muscle spasms. Do not take with alcohol or while driving or operating heavy machinery.  May cause drowsiness. 05/27/23   Valentino Nose, NP  traMADol (ULTRAM) 50 MG tablet Take 1 tablet (50 mg total) by mouth every 6 (six) hours as needed. 07/07/23   Dione Booze, MD    Family History Family History  Problem Relation Age of Onset   Anesthesia problems Maternal Grandmother        post-op nausea    Social History Social History   Tobacco Use   Smoking status: Never   Smokeless tobacco: Never  Vaping Use   Vaping status: Never Used  Substance Use Topics   Alcohol use: No   Drug use: No     Allergies   Penicillins and Sulfa antibiotics   Review of Systems Review of Systems PER HPI  Physical Exam Triage Vital Signs ED Triage Vitals  Encounter Vitals Group     BP 09/06/23 1106 109/78     Systolic BP Percentile --      Diastolic BP Percentile --      Pulse Rate 09/06/23 1106 100     Resp 09/06/23 1106 20     Temp 09/06/23 1106 98.6 F (37 C)     Temp Source 09/06/23 1106 Oral     SpO2 09/06/23 1106 97 %     Weight --      Height --      Head Circumference --      Peak Flow --      Pain Score 09/06/23 1102 4     Pain Loc --      Pain Education --      Exclude from Growth Chart --    No data found.  Updated Vital Signs BP 109/78 (BP Location: Right Arm)   Pulse 100   Temp 98.6 F (37 C) (Oral)   Resp 20   LMP 08/08/2023 (Approximate)   SpO2 97%   Visual Acuity Right Eye Distance:   Left Eye Distance:   Bilateral Distance:    Right Eye Near:   Left Eye Near:    Bilateral Near:     Physical Exam Vitals and nursing note reviewed.  Constitutional:      Appearance: Normal appearance. She is not ill-appearing.  HENT:     Head: Atraumatic.     Ears:     Comments: B/l EACs erythematous, edematous Mild eustachian tube dysfunction b/l Eyes:     Extraocular Movements:  Extraocular movements intact.     Conjunctiva/sclera: Conjunctivae normal.  Cardiovascular:     Rate and Rhythm: Normal rate and regular rhythm.     Heart sounds: Normal heart sounds.  Pulmonary:     Effort: Pulmonary effort is normal.     Breath sounds: Normal breath sounds.  Musculoskeletal:  General: Normal range of motion.     Cervical back: Normal range of motion and neck supple.  Skin:    General: Skin is warm and dry.  Neurological:     Mental Status: She is alert and oriented to person, place, and time.  Psychiatric:        Mood and Affect: Mood normal.        Thought Content: Thought content normal.        Judgment: Judgment normal.      UC Treatments / Results  Labs (all labs ordered are listed, but only abnormal results are displayed) Labs Reviewed - No data to display  EKG   Radiology No results found.  Procedures Procedures (including critical care time)  Medications Ordered in UC Medications - No data to display  Initial Impression / Assessment and Plan / UC Course  I have reviewed the triage vital signs and the nursing notes.  Pertinent labs & imaging results that were available during my care of the patient were reviewed by me and considered in my medical decision making (see chart for details).     Refill ciprodex drops for otitis externa, discussed flonase, sudafed prn for inner ear pressure. Return for worsening sxs.   Final Clinical Impressions(s) / UC Diagnoses   Final diagnoses:  Acute otitis externa of both ears, unspecified type  Eustachian tube dysfunction, bilateral   Discharge Instructions   None    ED Prescriptions     Medication Sig Dispense Auth. Provider   ciprofloxacin-dexamethasone (CIPRODEX) OTIC suspension Place 4 drops into the right ear 2 (two) times daily. 7.5 mL Particia Nearing, PA-C   fluticasone [facility]) 50 MCG/ACT nasal spray Place 1 spray into both nostrils 2 (two) times daily. 16 g Particia Nearing, [place]   pseudoephedrine (SUDAFED) 60 MG tablet Take 1 tablet (60 mg total) by mouth every 8 (eight) hours as needed for congestion. 20 tablet Particia Nearing, [place]      PDMP not reviewed this encounter.   Particia Nearing, [place] 09/10/23 2156

## 2023-10-05 ENCOUNTER — Ambulatory Visit
Admission: RE | Admit: 2023-10-05 | Discharge: 2023-10-05 | Disposition: A | Discharge status: Home / Self Care | Payer: Medicaid Other | Source: Ambulatory Visit | Attending: Internal Medicine | Admitting: Internal Medicine

## 2023-10-05 VITALS — BP 124/79 | HR 91 | Temp 98.3°F | Resp 20

## 2023-10-05 DIAGNOSIS — H6993 Unspecified Eustachian tube disorder, bilateral: Secondary | ICD-10-CM | POA: Diagnosis not present

## 2023-10-05 DIAGNOSIS — H6123 Impacted cerumen, bilateral: Secondary | ICD-10-CM | POA: Diagnosis not present

## 2023-10-05 NOTE — ED Triage Notes (Addendum)
Pt reports her ears are crackling and aching x 3 days

## 2023-10-05 NOTE — ED Provider Notes (Signed)
[facility]    CSN: [id number] Arrival date & time: 10/05/23  1047      History   Chief Complaint Chief Complaint  Patient presents with   Otalgia    HPI Jade Kelly is a 37 y.o. female.   Patient presenting today with 3-day history of acute on chronic crackling and popping, pressure and discomfort to bilateral ears.  She has a long history of chronic eustachian tube issues as well as otitis externa.  She denies any bleeding, drainage, loss of hearing, fever, chills, recent upper respiratory symptoms.  She has Flonase but states she has forgotten to take it of late.  Recently got some Sudafed from the store which has been helping some.    Past Medical History:  Diagnosis Date   Anxiety    Asthma    prn inhaler   Breast mass, right 11/2013   Depression    Family history of anesthesia complication    maternal grandmother has hx. 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/05/2021   Otitis externa 09/05/2021   Solitary cyst of breast 09/05/2021   Abnormal auditory perception of right ear 08/07/2021   Dizziness 08/07/2021   Excessive cerumen in both ear canals 08/07/2021   Post-operative state 12/17/2013   Metatarsal fracture 06/30/2012    Past Surgical History:  Procedure Laterality Date   BREAST LUMPECTOMY WITH RADIOACTIVE SEED LOCALIZATION Right 11/26/2018   Procedure: RIGHT BREAST LUMPECTOMY WITH RADIOACTIVE SEED LOCALIZATION;  Surgeon: Harriette Bouillon, MD;  Location: [facility] OR;  Service: General;  Laterality: Right;   EXCISION OF BREAST BIOPSY  Right 12/02/2013   Procedure: RIGHT EXCISIONAL BREAST BIOPSY;  Surgeon: Clovis Pu. Cornett, MD;  Location: [facility];  Service: General;  Laterality: Right;   WISDOM TOOTH EXTRACTION      OB History   No obstetric history on file.      Home Medications    Prior to Admission medications   Medication Sig Start Date End Date Taking? Authorizing Provider  albuterol (VENTOLIN HFA) 108 (90 Base) MCG/ACT inhaler Inhale 2 puffs into the lungs every 6 (six) hours as needed for wheezing or shortness of breath.     [provider]  ALPRAZolam Prudy Feeler) 0.25 MG tablet Take 0.25 mg by mouth daily as needed for anxiety.    [provider]  aspirin 81 MG chewable tablet Chew 81 mg by mouth as needed.    [provider]  buPROPion (WELLBUTRIN SR) 150 MG 12 hr tablet Take 150 mg by mouth daily.    [provider]  ciprofloxacin-dexamethasone (CIPRODEX) OTIC suspension Place 4 drops into the right ear 2 (two) times daily. 09/06/23   Particia Nearing, PA-C  clindamycin (CLEOCIN T) 1 % external solution Apply 1 application topically daily. Applied to face 11/17/18   [provider]  doxycycline (VIBRAMYCIN) 100 MG capsule Take 1 capsule (100 mg total) by mouth as needed (face breakouts). 07/06/23   Dione Booze, MD  FLUoxetine (PROZAC) 20 MG capsule Take 60 mg by mouth daily.    [provider]  fluticasone Aleda Grana)  50 MCG/ACT nasal spray Place 1 spray into both nostrils 2 (two) times daily. 09/06/23   Particia Nearing, PA-C  ibuprofen (ADVIL) 600 MG tablet Take 1 tablet (600 mg total) by mouth every 6 (six) hours as needed. 09/02/21   Domenick Gong, MD  loratadine (CLARITIN) 10 MG tablet Take 10 mg by mouth as needed for allergies.    [provider]  ondansetron (ZOFRAN-ODT) 4 MG disintegrating tablet Take 1 tablet (4 mg total) by mouth every 8 (eight) hours as needed for nausea or vomiting. 02/14/20   Benjiman Core, MD   pseudoephedrine (SUDAFED) 60 MG tablet Take 1 tablet (60 mg total) by mouth every 8 (eight) hours as needed for congestion. 09/06/23   Particia Nearing, PA-C  tiZANidine (ZANAFLEX) 4 MG tablet Take 1 tablet (4 mg total) by mouth every 8 (eight) hours as needed for muscle spasms. Do not take with alcohol or while driving or operating heavy machinery.  May cause drowsiness. 05/27/23   Valentino Nose, NP  traMADol (ULTRAM) 50 MG tablet Take 1 tablet (50 mg total) by mouth every 6 (six) hours as needed. 07/07/23   Dione Booze, MD    Family History Family History  Problem Relation Age of Onset   Anesthesia problems Maternal Grandmother        post-op nausea    Social History Social History   Tobacco Use   Smoking status: Never   Smokeless tobacco: Never  Vaping Use   Vaping status: Never Used  Substance Use Topics   Alcohol use: No   Drug use: No     Allergies   Penicillins and Sulfa antibiotics   Review of Systems Review of Systems Per HPI  Physical Exam Triage Vital Signs ED Triage Vitals  Encounter Vitals Group     BP 10/05/23 1055 124/79     Systolic BP Percentile --      Diastolic BP Percentile --      Pulse Rate 10/05/23 1055 91     Resp 10/05/23 1055 20     Temp 10/05/23 1055 98.3 F (36.8 C)     Temp Source 10/05/23 1055 Oral     SpO2 10/05/23 1055 96 %     Weight --      Height --      Head Circumference --      Peak Flow --      Pain Score 10/05/23 1053 0     Pain Loc --      Pain Education --      Exclude from Growth Chart --    No data found.  Updated Vital Signs BP 124/79 (BP Location: Right Arm)   Pulse 91   Temp 98.3 F (36.8 C) (Oral)   Resp 20   LMP 09/17/2023 (Approximate)   SpO2 96%   Visual Acuity Right Eye Distance:   Left Eye Distance:   Bilateral Distance:    Right Eye Near:   Left Eye Near:    Bilateral Near:     Physical Exam Vitals and nursing note reviewed.  Constitutional:      Appearance: Normal  appearance. She is not ill-appearing.  HENT:     Head: Atraumatic.     Ears:     Comments: Dried wax debris to the left EAC.  Bilateral middle ear effusions, no erythema, purulence Eyes:     Extraocular Movements: Extraocular movements intact.     Conjunctiva/sclera: Conjunctivae normal.  Cardiovascular:     Rate and  Rhythm: Normal rate and regular rhythm.     Heart sounds: Normal heart sounds.  Pulmonary:     Effort: Pulmonary effort is normal.     Breath sounds: Normal breath sounds.  Musculoskeletal:        General: Normal range of motion.     Cervical back: Normal range of motion and neck supple.  Skin:    General: Skin is warm and dry.  Neurological:     Mental Status: She is alert and oriented to person, place, and time.  Psychiatric:        Mood and Affect: Mood normal.        Thought Content: Thought content normal.        Judgment: Judgment normal.      UC Treatments / Results  Labs (all labs ordered are listed, but only abnormal results are displayed) Labs Reviewed - No data to display  EKG   Radiology No results found.  Procedures Procedures (including critical care time)  Medications Ordered in UC Medications - No data to display  Initial Impression / Assessment and Plan / UC Course  I have reviewed the triage vital signs and the nursing notes.  Pertinent labs & imaging results that were available during my care of the patient were reviewed by me and considered in my medical decision making (see chart for details).     Continue Flonase twice daily, antihistamines, Sudafed as needed.  Follow-up with ENT given chronicity and resistance to treatment.  No evidence of bacterial infection today.  Debrox drops, warm water lavage for cerumen debris.  Final Clinical Impressions(s) / UC Diagnoses   Final diagnoses:  Chronic Eustachian tube dysfunction, bilateral  Cerumen debris on tympanic membrane of both ears     Discharge Instructions      You  may use the Debrox drops twice daily as needed and warm water either from the shower or a bulb syringe to help rinse the wax debris.  Use Flonase and antihistamines daily and you may use Sudafed as needed for ear pressure.  Follow-up with the ear nose and throat specialist if not resolving.    ED Prescriptions   None    PDMP not reviewed this encounter.   Particia Nearing, [place] 10/05/23 1144

## 2023-10-05 NOTE — Discharge Instructions (Signed)
You may use the Debrox drops twice daily as needed and warm water either from the shower or a bulb syringe to help rinse the wax debris.  Use Flonase and antihistamines daily and you may use Sudafed as needed for ear pressure.  Follow-up with the ear nose and throat specialist if not resolving.

## 2024-01-26 ENCOUNTER — Telehealth: Payer: Self-pay | Admitting: Internal Medicine

## 2024-01-26 NOTE — Telephone Encounter (Signed)
 Patient c/o palpitations off / on x 3 days.  No c/o chest pain or sob.  Does mention dizziness "here or there".  Stated she started to feel the palpitations & her grandmother checked her pulse on her wrist.  Last evening running 100-120's.  This morning was running 100.  Questioned if anything had changed, more stress / anxiety.  She stated that she did drink soda x 2 days with no water, but has since been drinking more water.    Last seen 05/13/2023 - had Echo which was normal & GXT that showed - No arrhythmias during the stress test. Impaired functional capacity as patient was not able to achieve target heart rate.

## 2024-01-26 NOTE — Telephone Encounter (Signed)
 Patient c/o Palpitations:  High priority if patient c/o lightheadedness, shortness of breath, or chest pain  How long have you had palpitations/irregular HR/ Afib? Are you having the symptoms now?  Palpitations ongoing for a while per grandmother   Are you currently experiencing lightheadedness, SOB or CP?  No   Do you have a history of afib (atrial fibrillation) or irregular heart rhythm?  No   Have you checked your BP or HR? (document readings if available):  100 HR at rest and has gotten up to 120 at rest Hasn't checked BP   Are you experiencing any other symptoms?  Acid reflux and pain in stomach, but no cardiac issues

## 2024-01-27 ENCOUNTER — Encounter: Payer: Self-pay | Admitting: *Deleted

## 2024-01-27 NOTE — Telephone Encounter (Signed)
 No answer on mobile preferred number.  Will reply via mychart.

## 2024-01-27 NOTE — Telephone Encounter (Signed)
 Patient was returning cal but got disconnect. Please advise

## 2024-01-28 ENCOUNTER — Other Ambulatory Visit: Payer: Self-pay | Admitting: Internal Medicine

## 2024-01-28 ENCOUNTER — Ambulatory Visit: Attending: Internal Medicine

## 2024-01-28 DIAGNOSIS — R002 Palpitations: Secondary | ICD-10-CM

## 2024-04-12 DIAGNOSIS — R002 Palpitations: Secondary | ICD-10-CM | POA: Diagnosis not present

## 2024-04-26 ENCOUNTER — Ambulatory Visit: Payer: Self-pay | Admitting: Internal Medicine

## 2024-04-26 ENCOUNTER — Telehealth: Payer: Self-pay | Admitting: Internal Medicine

## 2024-04-26 MED ORDER — DILTIAZEM HCL 30 MG PO TABS: 30.0000 mg | ORAL_TABLET | Freq: Three times a day (TID) | ORAL | 5 refills | Status: AC | PRN

## 2024-04-26 NOTE — Telephone Encounter (Signed)
 Spoke with patient regarding medication Diltiazem 30 mg every 8 hours as needed for palpitations. Sent into [facility]. Advised her will reach back out when Dr. Mallipeddi responds to her questions via MyChart. Patient verbalized understanding

## 2024-04-26 NOTE — Telephone Encounter (Signed)
 Left message to call the office and sent MyChart message as well. PCP copied

## 2024-04-26 NOTE — Telephone Encounter (Signed)
-----   Message from Vishnu P Mallipeddi sent at 04/26/2024  9:14 AM EDT ----- Average HR 99 bpm.  Symptoms correlated with NSR (93 to 138 bpm) and PVC.  Okay to start diltiazem 30 mg every 8 hours as needed for palpitations.  Side effects include hypotension, ankle swelling,  constipation etc.  Do not take diltiazem if BP is less than 90 mmHg SBP. ----- Message ----- From: Stacia Diannah SQUIBB, MD Sent: 04/12/2024  10:46 AM EDT To: Vishnu P Mallipeddi, MD

## 2024-04-26 NOTE — Telephone Encounter (Signed)
 Patient returned Vitalia's phone call. Reached out to [facility] who was unavailable but did forward the patients questions to Dr. Mallipeddi via MyChart and patient is aware. FYI

## 2024-09-20 ENCOUNTER — Ambulatory Visit
Admission: RE | Admit: 2024-09-20 | Discharge: 2024-09-20 | Disposition: A | Discharge status: Home / Self Care | Payer: Self-pay | Source: Ambulatory Visit | Attending: Nurse Practitioner | Admitting: Nurse Practitioner

## 2024-09-20 VITALS — BP 133/77 | HR 88 | Temp 98.1°F | Resp 19

## 2024-09-20 DIAGNOSIS — H6991 Unspecified Eustachian tube disorder, right ear: Secondary | ICD-10-CM | POA: Diagnosis not present

## 2024-09-20 MED ORDER — FLUTICASONE PROPIONATE 50 MCG/ACT NA SUSP: 1.0000 | Freq: Two times a day (BID) | NASAL | 0 refills | Status: AC

## 2024-09-20 NOTE — Discharge Instructions (Signed)
 You have fluid behind the right ear drum.  Please use of Flonase  nasal spray as prescribed.  Follow-up with ENT if symptoms do not improve with treatment.

## 2024-09-20 NOTE — ED Provider Notes (Signed)
 [facility]    CSN: [id number] Arrival date & time: 09/20/24  9046      History   Chief Complaint Chief Complaint  Patient presents with   Dizziness    Dizzy with outer ear pain and some nausea. - Entered by patient    HPI Jade Kelly is a 38 y.o. female.   Patient presents today with grandmother who helps augment history.  She reports 3-4 day history of right ear pain and intermittent dizziness.  No ear drainage, fever, cough, congestion, or sore throat.  Patient can hear normally.  Grandmother reports history of otitis externa; has seen ENT in past and was prescribed Ciprodex  but is currently out of the medication and is wondering if she needs more.      Past Medical History:  Diagnosis Date   Anxiety    Asthma    prn inhaler   Breast mass, right 11/2013   Depression    Family history of anesthesia complication    maternal grandmother has hx. of post-op nausea   GERD (gastroesophageal reflux disease)     Patient Active Problem List   Diagnosis Date Noted   Chest pain of uncertain etiology 05/13/2023   Family history of CHF (congestive heart failure) 05/13/2023   Acne vulgaris 09/05/2021   Allergic rhinitis 09/05/2021   Anxiety 09/05/2021   Exercise induced bronchospasm 09/05/2021   Gastroesophageal reflux disease 09/05/2021   Major depression single episode, in partial remission 09/05/2021   Otitis externa 09/05/2021   Solitary cyst of breast 09/05/2021   Abnormal auditory perception of right ear 08/07/2021   Dizziness 08/07/2021   Excessive cerumen in both ear canals 08/07/2021   Post-operative state 12/17/2013   Metatarsal fracture 06/30/2012    Past Surgical History:  Procedure Laterality Date   BREAST LUMPECTOMY WITH RADIOACTIVE SEED LOCALIZATION Right 11/26/2018   Procedure: RIGHT BREAST LUMPECTOMY WITH RADIOACTIVE SEED LOCALIZATION;  Surgeon: Vanderbilt Ned, MD;  Location: [facility];  Service: General;  Laterality: Right;   EXCISION OF  BREAST BIOPSY Right 12/02/2013   Procedure: RIGHT EXCISIONAL BREAST BIOPSY;  Surgeon: Ned LABOR. Cornett, MD;  Location: [facility];  Service: General;  Laterality: Right;   WISDOM TOOTH EXTRACTION      OB History   No obstetric history on file.      Home Medications    Prior to Admission medications   Medication Sig Start Date End Date Taking? Authorizing Provider  albuterol  (VENTOLIN  HFA) 108 (90 Base) MCG/ACT inhaler Inhale 2 puffs into the lungs every 6 (six) hours as needed for wheezing or shortness of breath.    Yes [provider]  buPROPion (WELLBUTRIN SR) 150 MG 12 hr tablet Take 150 mg by mouth daily.   Yes [provider]  FLUoxetine (PROZAC) 20 MG capsule Take 60 mg by mouth daily.   Yes [provider]  ALPRAZolam (XANAX) 0.25 MG tablet Take 0.25 mg by mouth daily as needed for anxiety.    [provider]  aspirin 81 MG chewable tablet Chew 81 mg by mouth as needed.    [provider]  ciprofloxacin -dexamethasone  (CIPRODEX ) OTIC suspension Place 4 drops into the right ear 2 (two) times daily. 09/06/23   Stuart Vernell Norris, PA-C  clindamycin  (CLEOCIN  T) 1 % external solution Apply 1 application topically daily. Applied to face 11/17/18   [provider]  diltiazem  (CARDIZEM ) 30 MG tablet Take 1 tablet (30 mg total) by mouth every 8 (eight) hours as needed. 04/26/24  Mallipeddi, Vishnu P, MD  doxycycline  (VIBRAMYCIN ) 100 MG capsule Take 1 capsule (100 mg total) by mouth as needed (face breakouts). 07/06/23   Raford Lenis, MD  fluticasone  (FLONASE ) 50 MCG/ACT nasal spray Place 1 spray into both nostrils 2 (two) times daily. 09/20/24   Chandra Harlene LABOR, NP  ibuprofen  (ADVIL ) 600 MG tablet Take 1 tablet (600 mg total) by mouth every 6 (six) hours as needed. 09/02/21   Mortenson, Ashley, MD  loratadine (CLARITIN) 10 MG tablet Take 10 mg by mouth as needed for allergies.    [provider]  ondansetron   (ZOFRAN -ODT) 4 MG disintegrating tablet Take 1 tablet (4 mg total) by mouth every 8 (eight) hours as needed for nausea or vomiting. 02/14/20   Patsey Lot, MD  pseudoephedrine  (SUDAFED) 60 MG tablet Take 1 tablet (60 mg total) by mouth every 8 (eight) hours as needed for congestion. 09/06/23   Stuart Vernell Norris, PA-C  tiZANidine  (ZANAFLEX ) 4 MG tablet Take 1 tablet (4 mg total) by mouth every 8 (eight) hours as needed for muscle spasms. Do not take with alcohol or while driving or operating heavy machinery.  May cause drowsiness. 05/27/23   Chandra Harlene LABOR, NP  traMADol  (ULTRAM ) 50 MG tablet Take 1 tablet (50 mg total) by mouth every 6 (six) hours as needed. 07/07/23   Raford Lenis, MD    Family History Family History  Problem Relation Age of Onset   Anesthesia problems Maternal Grandmother        post-op nausea    Social History Social History   Tobacco Use   Smoking status: Never   Smokeless tobacco: Never  Vaping Use   Vaping status: Never Used  Substance Use Topics   Alcohol use: No   Drug use: No     Allergies   Penicillins and Sulfa  antibiotics   Review of Systems Review of Systems Per HPI  Physical Exam Triage Vital Signs ED Triage Vitals  Encounter Vitals Group     BP 09/20/24 1102 133/77     Girls Systolic BP Percentile --      Girls Diastolic BP Percentile --      Boys Systolic BP Percentile --      Boys Diastolic BP Percentile --      Pulse Rate 09/20/24 1102 88     Resp 09/20/24 1102 19     Temp 09/20/24 1102 98.1 F (36.7 C)     Temp Source 09/20/24 1102 Oral     SpO2 09/20/24 1102 96 %     Weight --      Height --      Head Circumference --      Peak Flow --      Pain Score 09/20/24 1058 0     Pain Loc --      Pain Education --      Exclude from Growth Chart --    No data found.  Updated Vital Signs BP 133/77 (BP Location: Right Arm)   Pulse 88   Temp 98.1 F (36.7 C) (Oral)   Resp 19   LMP 09/05/2024   SpO2 96%   Visual  Acuity Right Eye Distance:   Left Eye Distance:   Bilateral Distance:    Right Eye Near:   Left Eye Near:    Bilateral Near:     Physical Exam Vitals and nursing note reviewed.  Constitutional:      General: She is not in acute distress.    Appearance: Normal appearance.  She is not toxic-appearing.  HENT:     Head: Normocephalic and atraumatic.     Right Ear: A middle ear effusion is present.     Left Ear: Tympanic membrane, ear canal and external ear normal. There is no impacted cerumen.     Nose: Nose normal. No congestion or rhinorrhea.     Mouth/Throat:     Mouth: Mucous membranes are moist.     Pharynx: Oropharynx is clear. No posterior oropharyngeal erythema.  Eyes:     General:        Right eye: No discharge.        Left eye: No discharge.     Extraocular Movements: Extraocular movements intact.  Cardiovascular:     Rate and Rhythm: Normal rate.  Pulmonary:     Effort: Pulmonary effort is normal. No respiratory distress.  Musculoskeletal:     Cervical back: Normal range of motion.  Lymphadenopathy:     Cervical: No cervical adenopathy.  Skin:    General: Skin is warm and dry.     Capillary Refill: Capillary refill takes less than 2 seconds.     Coloration: Skin is not jaundiced or pale.     Findings: No erythema.  Neurological:     Mental Status: She is alert and oriented to person, place, and time.  Psychiatric:        Behavior: Behavior is cooperative.      UC Treatments / Results  Labs (all labs ordered are listed, but only abnormal results are displayed) Labs Reviewed - No data to display  EKG   Radiology No results found.  Procedures Procedures (including critical care time)  Medications Ordered in UC Medications - No data to display  Initial Impression / Assessment and Plan / UC Course  I have reviewed the triage vital signs and the nursing notes.  Pertinent labs & imaging results that were available during my care of the patient were  reviewed by me and considered in my medical decision making (see chart for details).   In triage, vital signs are stable and patient is well-appearing.  On examination, patient has a middle ear effusion of the right ear.  Will treat with Flonase .  Recommended follow-up with ENT if symptoms do not improve with treatment.  Return and ER precautions discussed.  The patient was given the opportunity to ask questions.  All questions answered to their satisfaction.  The patient is in agreement to this plan.   Final Clinical Impressions(s) / UC Diagnoses   Final diagnoses:  Chronic dysfunction of right eustachian tube     Discharge Instructions      You have fluid behind the right ear drum.  Please use of Flonase  nasal spray as prescribed.  Follow-up with ENT if symptoms do not improve with treatment.    ED Prescriptions     Medication Sig Dispense Auth. Provider   fluticasone  (FLONASE ) 50 MCG/ACT nasal spray Place 1 spray into both nostrils 2 (two) times daily. 16 g Chandra Harlene LABOR, NP      PDMP not reviewed this encounter.   Chandra Harlene LABOR, NP 09/20/24 1123

## 2024-09-20 NOTE — ED Triage Notes (Signed)
 Pt states that she has some right ear pain, dizziness and nausea.  Ear pain x3-4 days Dizziness and nausea x1 day
